# Patient Record
Sex: Male | Born: 1975 | Marital: Married | Smoking: Current some day smoker
Health system: Southern US, Community
[De-identification: ages and names within clinical notes are randomized; demographics above are authoritative.]

## PROBLEM LIST (undated history)

## (undated) DIAGNOSIS — I10 Essential (primary) hypertension: Secondary | ICD-10-CM

## (undated) DIAGNOSIS — F32A Depression, unspecified: Secondary | ICD-10-CM

## (undated) DIAGNOSIS — F419 Anxiety disorder, unspecified: Secondary | ICD-10-CM

## (undated) DIAGNOSIS — S0990XA Unspecified injury of head, initial encounter: Secondary | ICD-10-CM

## (undated) DIAGNOSIS — F329 Major depressive disorder, single episode, unspecified: Secondary | ICD-10-CM

## (undated) DIAGNOSIS — S2239XA Fracture of one rib, unspecified side, initial encounter for closed fracture: Secondary | ICD-10-CM

## (undated) DIAGNOSIS — S27339A Laceration of lung, unspecified, initial encounter: Secondary | ICD-10-CM

## (undated) HISTORY — DX: Major depressive disorder, single episode, unspecified: F32.9

## (undated) HISTORY — DX: Essential (primary) hypertension: I10

## (undated) HISTORY — DX: Anxiety disorder, unspecified: F41.9

## (undated) HISTORY — DX: Depression, unspecified: F32.A

## (undated) HISTORY — PX: OTHER SURGICAL HISTORY: SHX169

## (undated) HISTORY — DX: Laceration of lung, unspecified, initial encounter: S27.339A

## (undated) HISTORY — DX: Unspecified injury of head, initial encounter: S09.90XA

---

## 2009-01-20 ENCOUNTER — Emergency Department: Payer: Self-pay | Admitting: Emergency Medicine

## 2009-09-06 ENCOUNTER — Emergency Department: Payer: Self-pay | Admitting: Emergency Medicine

## 2009-09-10 ENCOUNTER — Emergency Department: Payer: Self-pay | Admitting: Emergency Medicine

## 2010-02-16 ENCOUNTER — Inpatient Hospital Stay: Payer: Self-pay | Admitting: Internal Medicine

## 2010-02-18 ENCOUNTER — Emergency Department: Payer: Self-pay | Admitting: Emergency Medicine

## 2010-09-25 ENCOUNTER — Emergency Department: Payer: Self-pay | Admitting: Emergency Medicine

## 2011-03-13 ENCOUNTER — Emergency Department: Payer: Self-pay | Admitting: Emergency Medicine

## 2011-06-09 ENCOUNTER — Emergency Department: Payer: Self-pay | Admitting: Emergency Medicine

## 2012-07-04 ENCOUNTER — Ambulatory Visit: Payer: Self-pay | Admitting: Emergency Medicine

## 2013-05-21 ENCOUNTER — Emergency Department: Payer: Self-pay | Admitting: Emergency Medicine

## 2013-10-11 ENCOUNTER — Emergency Department: Payer: Self-pay | Admitting: Emergency Medicine

## 2013-10-11 LAB — URINALYSIS, COMPLETE
Blood: NEGATIVE
Glucose,UR: NEGATIVE mg/dL (ref 0–75)
Leukocyte Esterase: NEGATIVE
Nitrite: NEGATIVE
Ph: 7 (ref 4.5–8.0)
Specific Gravity: 1.026 (ref 1.003–1.030)
Squamous Epithelial: <1
WBC UR: 3 /HPF (ref 0–5)

## 2013-10-11 LAB — COMPREHENSIVE METABOLIC PANEL
Albumin: 4 g/dL (ref 3.4–5.0)
Alkaline Phosphatase: 86 U/L (ref 50–136)
BUN: 14 mg/dL (ref 7–18)
Chloride: 103 mmol/L (ref 98–107)
Co2: 28 mmol/L (ref 21–32)
Creatinine: 0.91 mg/dL (ref 0.60–1.30)
EGFR (Non-African Amer.): >60
Osmolality: 281 (ref 275–301)
Potassium: 3.4 mmol/L — ABNORMAL LOW (ref 3.5–5.1)
SGOT(AST): 82 U/L — ABNORMAL HIGH (ref 15–37)
SGPT (ALT): 33 U/L (ref 12–78)
Total Protein: 7.9 g/dL (ref 6.4–8.2)

## 2013-10-11 LAB — CBC WITH DIFFERENTIAL/PLATELET
Basophil %: 0.7 %
Eosinophil #: 0.1 10*3/uL (ref 0.0–0.7)
Eosinophil %: 0.7 %
HCT: 38.2 % — ABNORMAL LOW (ref 40.0–52.0)
HGB: 13.2 g/dL (ref 13.0–18.0)
Lymphocyte #: 2.4 10*3/uL (ref 1.0–3.6)
Lymphocyte %: 21 %
MCH: 28.9 pg (ref 26.0–34.0)
MCHC: 34.6 g/dL (ref 32.0–36.0)
MCV: 84 fL (ref 80–100)
Monocyte #: 0.4 x10 3/mm (ref 0.2–1.0)
Monocyte %: 3.7 %
Neutrophil %: 73.9 %
Platelet: 231 10*3/uL (ref 150–440)
RBC: 4.57 10*6/uL (ref 4.40–5.90)
RDW: 15.3 % — ABNORMAL HIGH (ref 11.5–14.5)
WBC: 11.3 10*3/uL — ABNORMAL HIGH (ref 3.8–10.6)

## 2014-01-10 ENCOUNTER — Emergency Department: Payer: Self-pay | Admitting: Emergency Medicine

## 2014-04-12 ENCOUNTER — Emergency Department: Payer: Self-pay | Admitting: Emergency Medicine

## 2014-04-12 LAB — URINALYSIS, COMPLETE
Bacteria: NONE SEEN
Bilirubin,UR: NEGATIVE
Blood: NEGATIVE
GLUCOSE, UR: NEGATIVE mg/dL (ref 0–75)
Leukocyte Esterase: NEGATIVE
Nitrite: NEGATIVE
Ph: 8 (ref 4.5–8.0)
SPECIFIC GRAVITY: 1.023 (ref 1.003–1.030)
WBC UR: NONE SEEN /HPF (ref 0–5)

## 2014-04-12 LAB — CBC WITH DIFFERENTIAL/PLATELET
Basophil #: 0.1 10*3/uL (ref 0.0–0.1)
Basophil %: 0.5 %
EOS PCT: 0.1 %
Eosinophil #: 0 10*3/uL (ref 0.0–0.7)
HCT: 42.2 % (ref 40.0–52.0)
HGB: 13.7 g/dL (ref 13.0–18.0)
Lymphocyte #: 1.4 10*3/uL (ref 1.0–3.6)
Lymphocyte %: 10.4 %
MCH: 28.4 pg (ref 26.0–34.0)
MCHC: 32.5 g/dL (ref 32.0–36.0)
MCV: 87 fL (ref 80–100)
Monocyte #: 0.4 x10 3/mm (ref 0.2–1.0)
Monocyte %: 2.6 %
NEUTROS PCT: 86.4 %
Neutrophil #: 11.6 10*3/uL — ABNORMAL HIGH (ref 1.4–6.5)
Platelet: 208 10*3/uL (ref 150–440)
RBC: 4.83 10*6/uL (ref 4.40–5.90)
RDW: 14.9 % — AB (ref 11.5–14.5)
WBC: 13.4 10*3/uL — AB (ref 3.8–10.6)

## 2014-04-12 LAB — COMPREHENSIVE METABOLIC PANEL
ALK PHOS: 79 U/L
ALT: 27 U/L (ref 12–78)
ANION GAP: 17 — AB (ref 7–16)
Albumin: 4.3 g/dL (ref 3.4–5.0)
BUN: 9 mg/dL (ref 7–18)
Bilirubin,Total: 0.6 mg/dL (ref 0.2–1.0)
CALCIUM: 9.5 mg/dL (ref 8.5–10.1)
Chloride: 100 mmol/L (ref 98–107)
Co2: 25 mmol/L (ref 21–32)
Creatinine: 1.07 mg/dL (ref 0.60–1.30)
EGFR (Non-African Amer.): >60
Glucose: 139 mg/dL — ABNORMAL HIGH (ref 65–99)
Osmolality: 284 (ref 275–301)
POTASSIUM: 3.6 mmol/L (ref 3.5–5.1)
SGOT(AST): 45 U/L — ABNORMAL HIGH (ref 15–37)
Sodium: 142 mmol/L (ref 136–145)
Total Protein: 8.7 g/dL — ABNORMAL HIGH (ref 6.4–8.2)

## 2014-04-12 LAB — LIPASE, BLOOD: LIPASE: 141 U/L (ref 73–393)

## 2015-02-10 ENCOUNTER — Ambulatory Visit: Payer: Self-pay | Admitting: Emergency Medicine

## 2015-02-11 ENCOUNTER — Emergency Department: Payer: Self-pay | Admitting: Emergency Medicine

## 2015-03-21 ENCOUNTER — Emergency Department (HOSPITAL_COMMUNITY): Payer: 59

## 2015-03-21 ENCOUNTER — Emergency Department (HOSPITAL_COMMUNITY)
Admission: EM | Admit: 2015-03-21 | Discharge: 2015-03-21 | Disposition: A | Discharge status: Home / Self Care | Payer: 59 | Attending: Emergency Medicine | Admitting: Emergency Medicine

## 2015-03-21 ENCOUNTER — Encounter (HOSPITAL_COMMUNITY): Payer: Self-pay

## 2015-03-21 DIAGNOSIS — D72829 Elevated white blood cell count, unspecified: Secondary | ICD-10-CM | POA: Diagnosis not present

## 2015-03-21 DIAGNOSIS — Z8781 Personal history of (healed) traumatic fracture: Secondary | ICD-10-CM | POA: Diagnosis not present

## 2015-03-21 DIAGNOSIS — R1110 Vomiting, unspecified: Secondary | ICD-10-CM | POA: Diagnosis not present

## 2015-03-21 DIAGNOSIS — R1010 Upper abdominal pain, unspecified: Secondary | ICD-10-CM | POA: Diagnosis not present

## 2015-03-21 DIAGNOSIS — R1033 Periumbilical pain: Secondary | ICD-10-CM | POA: Insufficient documentation

## 2015-03-21 DIAGNOSIS — Z720 Tobacco use: Secondary | ICD-10-CM | POA: Diagnosis not present

## 2015-03-21 DIAGNOSIS — Z791 Long term (current) use of non-steroidal anti-inflammatories (NSAID): Secondary | ICD-10-CM | POA: Diagnosis not present

## 2015-03-21 DIAGNOSIS — R109 Unspecified abdominal pain: Secondary | ICD-10-CM | POA: Diagnosis present

## 2015-03-21 DIAGNOSIS — R1013 Epigastric pain: Secondary | ICD-10-CM | POA: Insufficient documentation

## 2015-03-21 HISTORY — DX: Fracture of one rib, unspecified side, initial encounter for closed fracture: S22.39XA

## 2015-03-21 LAB — URINALYSIS, ROUTINE W REFLEX MICROSCOPIC
Bilirubin Urine: NEGATIVE
GLUCOSE, UA: NEGATIVE mg/dL
Ketones, ur: NEGATIVE mg/dL
Leukocytes, UA: NEGATIVE
Nitrite: NEGATIVE
Protein, ur: >300 mg/dL — AB
SPECIFIC GRAVITY, URINE: 1.025 (ref 1.005–1.030)
Urobilinogen, UA: 0.2 mg/dL (ref 0.0–1.0)
pH: 7 (ref 5.0–8.0)

## 2015-03-21 LAB — COMPREHENSIVE METABOLIC PANEL
ALK PHOS: 65 U/L (ref 39–117)
ALT: 19 U/L (ref 0–53)
AST: 31 U/L (ref 0–37)
Albumin: 4.3 g/dL (ref 3.5–5.2)
Anion gap: 8 (ref 5–15)
BILIRUBIN TOTAL: 0.7 mg/dL (ref 0.3–1.2)
BUN: 20 mg/dL (ref 6–23)
CHLORIDE: 106 mmol/L (ref 96–112)
CO2: 25 mmol/L (ref 19–32)
CREATININE: 1.1 mg/dL (ref 0.50–1.35)
Calcium: 9.1 mg/dL (ref 8.4–10.5)
GFR calc Af Amer: >90 mL/min (ref >90)
GFR, EST NON AFRICAN AMERICAN: 83 mL/min — AB (ref >90)
Glucose, Bld: 144 mg/dL — ABNORMAL HIGH (ref 70–99)
POTASSIUM: 4 mmol/L (ref 3.5–5.1)
Sodium: 139 mmol/L (ref 135–145)
Total Protein: 8.1 g/dL (ref 6.0–8.3)

## 2015-03-21 LAB — CBC WITH DIFFERENTIAL/PLATELET
BASOS ABS: 0 10*3/uL (ref 0.0–0.1)
Basophils Relative: 0 % (ref 0–1)
EOS ABS: 0.1 10*3/uL (ref 0.0–0.7)
EOS PCT: 1 % (ref 0–5)
HCT: 38.2 % — ABNORMAL LOW (ref 39.0–52.0)
Hemoglobin: 12.8 g/dL — ABNORMAL LOW (ref 13.0–17.0)
Lymphocytes Relative: 15 % (ref 12–46)
Lymphs Abs: 2.7 10*3/uL (ref 0.7–4.0)
MCH: 28 pg (ref 26.0–34.0)
MCHC: 33.5 g/dL (ref 30.0–36.0)
MCV: 83.6 fL (ref 78.0–100.0)
MONO ABS: 0.8 10*3/uL (ref 0.1–1.0)
MONOS PCT: 4 % (ref 3–12)
NEUTROS ABS: 15.1 10*3/uL — AB (ref 1.7–7.7)
Neutrophils Relative %: 80 % — ABNORMAL HIGH (ref 43–77)
Platelets: 195 10*3/uL (ref 150–400)
RBC: 4.57 MIL/uL (ref 4.22–5.81)
RDW: 15.1 % (ref 11.5–15.5)
WBC: 18.7 10*3/uL — ABNORMAL HIGH (ref 4.0–10.5)

## 2015-03-21 LAB — URINE MICROSCOPIC-ADD ON

## 2015-03-21 LAB — LIPASE, BLOOD: LIPASE: 31 U/L (ref 11–59)

## 2015-03-21 MED ORDER — METRONIDAZOLE 500 MG PO TABS
500.0000 mg | ORAL_TABLET | Freq: Two times a day (BID) | ORAL | Status: DC
Start: 2015-03-21 — End: 2015-06-06

## 2015-03-21 MED ORDER — SODIUM CHLORIDE 0.9 % IV BOLUS (SEPSIS)
1000.0000 mL | Freq: Once | INTRAVENOUS | Status: AC
  Administered 2015-03-21: 1000 mL via INTRAVENOUS

## 2015-03-21 MED ORDER — IOHEXOL 300 MG/ML  SOLN
50.0000 mL | Freq: Once | INTRAMUSCULAR | Status: AC | PRN
  Administered 2015-03-21: 50 mL via ORAL

## 2015-03-21 MED ORDER — HYDROMORPHONE HCL 1 MG/ML IJ SOLN
1.0000 mg | Freq: Once | INTRAMUSCULAR | Status: AC
  Administered 2015-03-21: 1 mg via INTRAVENOUS
  Filled 2015-03-21: qty 1

## 2015-03-21 MED ORDER — OMEPRAZOLE 20 MG PO CPDR: 20.0000 mg | DELAYED_RELEASE_CAPSULE | Freq: Every day | ORAL | Status: DC

## 2015-03-21 MED ORDER — DICYCLOMINE HCL 20 MG PO TABS: 20.0000 mg | ORAL_TABLET | Freq: Two times a day (BID) | ORAL | Status: DC

## 2015-03-21 MED ORDER — GI COCKTAIL ~~LOC~~
30.0000 mL | Freq: Once | ORAL | Status: AC
  Administered 2015-03-21: 30 mL via ORAL
  Filled 2015-03-21: qty 30

## 2015-03-21 MED ORDER — PROCHLORPERAZINE EDISYLATE 5 MG/ML IJ SOLN
10.0000 mg | Freq: Once | INTRAMUSCULAR | Status: AC
  Administered 2015-03-21: 10 mg via INTRAVENOUS
  Filled 2015-03-21: qty 2

## 2015-03-21 MED ORDER — CIPROFLOXACIN HCL 500 MG PO TABS: 500.0000 mg | ORAL_TABLET | Freq: Two times a day (BID) | ORAL | Status: DC

## 2015-03-21 MED ORDER — IOHEXOL 300 MG/ML  SOLN
100.0000 mL | Freq: Once | INTRAMUSCULAR | Status: AC | PRN
  Administered 2015-03-21: 100 mL via INTRAVENOUS

## 2015-03-21 MED ORDER — ONDANSETRON 4 MG PO TBDP: 4.0000 mg | ORAL_TABLET | Freq: Three times a day (TID) | ORAL | Status: DC | PRN

## 2015-03-21 NOTE — ED Notes (Signed)
Patient c/o generalized abdominal pain and vomiting x 10 or more since this AM. Patient denies any diarrhea or fever.

## 2015-03-21 NOTE — Discharge Instructions (Signed)
As discussed, today's evaluation has been somewhat reassuring, but with her continued abdominal pain, it is important that you follow-up with our gastroenterologist to complete her evaluation.  Return here for concerning changes in your condition.    Abdominal Pain Many things can cause abdominal pain. Usually, abdominal pain is not caused by a disease and will improve without treatment. It can often be observed and treated at home. Your health care provider will do a physical exam and possibly order blood tests and X-rays to help determine the seriousness of your pain. However, in many cases, more time must pass before a clear cause of the pain can be found. Before that point, your health care provider may not know if you need more testing or further treatment. HOME CARE INSTRUCTIONS  Monitor your abdominal pain for any changes. The following actions may help to alleviate any discomfort you are experiencing:  Only take over-the-counter or prescription medicines as directed by your health care provider.  Do not take laxatives unless directed to do so by your health care provider.  Try a clear liquid diet (broth, tea, or water) as directed by your health care provider. Slowly move to a bland diet as tolerated. SEEK MEDICAL CARE IF:  You have unexplained abdominal pain.  You have abdominal pain associated with nausea or diarrhea.  You have pain when you urinate or have a bowel movement.  You experience abdominal pain that wakes you in the night.  You have abdominal pain that is worsened or improved by eating food.  You have abdominal pain that is worsened with eating fatty foods.  You have a fever. SEEK IMMEDIATE MEDICAL CARE IF:   Your pain does not go away within 2 hours.  You keep throwing up (vomiting).  Your pain is felt only in portions of the abdomen, such as the right side or the left lower portion of the abdomen.  You pass bloody or black tarry stools. MAKE SURE  YOU:  Understand these instructions.   Will watch your condition.   Will get help right away if you are not doing well or get worse.  Document Released: 08/19/2005 Document Revised: 11/14/2013 Document Reviewed: 07/19/2013 [facility]ExitCare Patient Information 2015 [place]Care, [place]. This information is not intended to replace advice given to you by your health care provider. Make sure you discuss any questions you have with your health care provider.

## 2015-03-21 NOTE — ED Notes (Signed)
Pt, sent by [facility], c/o generalized abdominal pain and n/v x 2 years.  Pt reported that symptoms hve gotten more severe x 2 days.  Zofran given at office.

## 2015-03-21 NOTE — ED Notes (Signed)
Patient transported to CT 

## 2015-03-21 NOTE — Progress Notes (Signed)
[id number] male self pay pt recently moved from [address] [place] [phone] ( [place]) to [address] [place] [facility]), c/o generalized abdominal pain and n/v x 2 years. Pt reported that symptoms hve gotten more severe x 2 days. Zofran given at office  CM spoke with pt who [place] [facility] [place] [place]. CM discussed and provided written information for self pay pcps vs EDPs, importance of pcp for f/u care, www.needymeds.org, www.goodrx.com, discounted pharmacies and other [place] resources such as [facility] , [facility], affordable care act,  financial assistance, self pay dental services, [place] med assist, DSS and  health department  Reviewed resources for [place] self pay pcps like [facility], family medicine at [address], [facility] family practice, general medical clinics, [facility] urgent care plus others, medication resources, [facility] out patient pharmacies and housing Pt voiced understanding and appreciation of resources provided   Provided [facility] contact information  Agreed to referral CM completed referral  Pt states he [facility] gold [facility]let [facility] checked lost and found and CT room staff.   CM called [facility] on [address] and [address] Sheralyn Boatmanoni at [employer] [phone] states pt's domestic partner is also calling to inquire about bracelet and they are looking for it at [facility] Pt present in front of CM at acute ED nursing station when CM called both urgent cares and voiced understanding Cm provided him with [address] [facility] address and number

## 2015-03-21 NOTE — ED Provider Notes (Signed)
CSN: [id number]     Arrival date & time 03/21/15  1012 History   First MD Initiated Contact with Patient 03/21/15 1042     Chief Complaint  Patient presents with  . Emesis  . Abdominal Pain     (Consider location/radiation/quality/duration/timing/severity/associated sxs/prior Treatment) HPI Patient presents with concern of increasing abdominal pain nausea, vomiting. Patient has history of abdominal pain for at least 2 years, with unclear diagnosis, but with suspicion of ulcer. He states over the past 24 hours he has had increased abdominal pain in a typical distributional, with much more severe, sharp characteristics. Pain is midline upper abdomen, with diffuse radiation. There is concurrent anorexia, nausea, multiple episodes of vomiting today. No diarrhea today. No relief with anything. Fever, chills, chest pain, dyspnea. Patient was previously advised to follow-up with gastroenterology, but has not done so. Patient smokes, drinks occasionally.   Smoking cessation provided, particularly in light of this patient's evaluation in the ED.  Past Medical History  Diagnosis Date  . Rib fracture    No past surgical history on file. History reviewed. No pertinent family history. History  Substance Use Topics  . Smoking status: Current Every Day Smoker -- 0.25 packs/day    Types: Cigarettes  . Smokeless tobacco: Never Used  . Alcohol Use: Yes     Comment: occasionally    Review of Systems  Constitutional:       Per HPI, otherwise negative  HENT:       Per HPI, otherwise negative  Respiratory:       Per HPI, otherwise negative  Cardiovascular:       Per HPI, otherwise negative  Gastrointestinal: Positive for vomiting and abdominal pain.  Endocrine:       Negative aside from HPI  Genitourinary:       Neg aside from HPI   Musculoskeletal:       Per HPI, otherwise negative  Skin: Negative.   Neurological: Negative for syncope.      Allergies  Review of patient's  allergies indicates no known allergies.  Home Medications   Prior to Admission medications   Medication Sig Start Date End Date Taking? Authorizing Provider  meloxicam (MOBIC) 15 MG tablet Take 15 mg by mouth daily as needed for pain.   Yes Historical Provider, MD   BP 176/96 mmHg  Pulse 70  Temp(Src) 97.8 F (36.6 C) (Oral)  Resp 20  SpO2 97% Physical Exam  Constitutional: He is oriented to person, place, and time. He appears well-developed. No distress.  HENT:  Head: Normocephalic and atraumatic.  Eyes: Conjunctivae and EOM are normal.  Cardiovascular: Normal rate and regular rhythm.   Pulmonary/Chest: Effort normal. No stridor. No respiratory distress.  Abdominal: He exhibits no distension. There is tenderness in the epigastric area and periumbilical area.  Musculoskeletal: He exhibits no edema.  Neurological: He is alert and oriented to person, place, and time.  Skin: Skin is warm and dry.  Psychiatric: He has a normal mood and affect.  Nursing note and vitals reviewed.   ED Course  Procedures (including critical care time) Labs Review Labs Reviewed  CBC WITH DIFFERENTIAL/PLATELET - Abnormal; Notable for the following:    WBC 18.7 (*)    Hemoglobin 12.8 (*)    HCT 38.2 (*)    Neutrophils Relative % 80 (*)    Neutro Abs 15.1 (*)    All other components within normal limits  COMPREHENSIVE METABOLIC PANEL - Abnormal; Notable for the following:    Glucose, Bld  144 (*)    GFR calc non Af Amer 83 (*)    All other components within normal limits  URINALYSIS, ROUTINE W REFLEX MICROSCOPIC - Abnormal; Notable for the following:    APPearance CLOUDY (*)    Hgb urine dipstick TRACE (*)    Protein, ur >300 (*)    All other components within normal limits  LIPASE, BLOOD  URINE MICROSCOPIC-ADD ON     On repeat exam the patient remains in a great discomfort. With his abdominal pain, CT scan will be performed.   Imaging Review Ct Abdomen Pelvis W Contrast  03/21/2015    CLINICAL DATA:  Acute onset of generalized abdominal pain earlier today. Nausea and vomiting.  EXAM: CT ABDOMEN AND PELVIS WITH CONTRAST  TECHNIQUE: Multidetector CT imaging of the abdomen and pelvis was performed using the standard protocol following bolus administration of intravenous contrast.  CONTRAST:  100mL OMNIPAQUE IOHEXOL 300 MG/ML IV. Oral contrast was also administered.  COMPARISON:  10/11/2013, 02/17/2010.  FINDINGS: Liver:  Normal in size and appearance.  Biliary: Normal-appearing gallbladder without calcified gallstones. No biliary ductal dilation.  Spleen:  Normal in size and appearance.  Pancreas:  Normal in appearance.  No pancreatic ductal dilation.  Adrenal glands:  Normal in appearance.  Genitourinary: Both kidneys normal in size and appearance. No evidence of urinary tract calculi. Normal-appearing decompressed urinary bladder.  Median lobe prostate gland enlargement. Remainder of the gland normal in size and appearance. Normal seminal vesicles.  Gastrointestinal: Stomach normal in appearance for degree of distention. Normal-appearing small bowel. Entire colon decompressed which likely accounts for the apparent diffuse wall thickening. Normal appendix in the right upper pelvis.  Vascular: Moderate aortoiliac atherosclerosis. Patent visceral arteries. Accessory lower pole right renal artery.  Lymphatic:  No pathologic lymphadenopathy in the abdomen or pelvis.  Ascites: Absent.  Musculoskeletal: Regional skeleton intact.  Other findings: None.  Visualized lower thorax: Heart size upper normal. Lung bases clear apart from the expected dependent atelectasis.  IMPRESSION: 1. Apparent wall thickening involving the entire colon I believe is related to the fact that the colon is decompressed rather than true colitis. Does the patient have diarrhea? 2. Otherwise, no acute abnormality in the abdomen or pelvis. 3. Moderate aortoiliac atherosclerosis, advanced for age.   Electronically Signed   By: Hulan Saashomas   Lawrence M.D.   On: 03/21/2015 14:13    2:44 PM I reviewed the results (including imaging as performed), agree with the interpretation  On repeat exam the patient appears better.  We reviewed all findings.   MDM   Young male presents with worsening abdominal pain. On exam patient is afebrile, with non-peritoneal abdomen, though with notable midline tenderness in the upper abdomen.  With tenderness to palpation, CT scan was performed.  This was largely reassuring, though there is suspicion for colitis given the patient's leukocytosis, pain. Patient has not had colonoscopy, was started on antibiotics, analgesics, will follow-up with gastroenterology.   Gerhard Munchobert Tu Shimmel, MD 03/21/15 [id number]

## 2015-06-06 ENCOUNTER — Encounter (HOSPITAL_COMMUNITY): Payer: Self-pay | Admitting: Family Medicine

## 2015-06-06 ENCOUNTER — Emergency Department (HOSPITAL_COMMUNITY)
Admission: EM | Admit: 2015-06-06 | Discharge: 2015-06-06 | Disposition: A | Discharge status: Home / Self Care | Payer: 59 | Attending: Emergency Medicine | Admitting: Emergency Medicine

## 2015-06-06 DIAGNOSIS — R112 Nausea with vomiting, unspecified: Secondary | ICD-10-CM | POA: Diagnosis not present

## 2015-06-06 DIAGNOSIS — Z720 Tobacco use: Secondary | ICD-10-CM | POA: Diagnosis not present

## 2015-06-06 DIAGNOSIS — R61 Generalized hyperhidrosis: Secondary | ICD-10-CM | POA: Diagnosis not present

## 2015-06-06 DIAGNOSIS — Z792 Long term (current) use of antibiotics: Secondary | ICD-10-CM | POA: Insufficient documentation

## 2015-06-06 DIAGNOSIS — Z79899 Other long term (current) drug therapy: Secondary | ICD-10-CM | POA: Insufficient documentation

## 2015-06-06 DIAGNOSIS — K219 Gastro-esophageal reflux disease without esophagitis: Secondary | ICD-10-CM | POA: Insufficient documentation

## 2015-06-06 DIAGNOSIS — R1013 Epigastric pain: Secondary | ICD-10-CM | POA: Insufficient documentation

## 2015-06-06 DIAGNOSIS — Z8781 Personal history of (healed) traumatic fracture: Secondary | ICD-10-CM | POA: Diagnosis not present

## 2015-06-06 LAB — CBC WITH DIFFERENTIAL/PLATELET
BASOS ABS: 0 10*3/uL (ref 0.0–0.1)
BASOS PCT: 0 % (ref 0–1)
Eosinophils Absolute: 0 10*3/uL (ref 0.0–0.7)
Eosinophils Relative: 0 % (ref 0–5)
HEMATOCRIT: 40.6 % (ref 39.0–52.0)
Hemoglobin: 13.9 g/dL (ref 13.0–17.0)
LYMPHS ABS: 1.8 10*3/uL (ref 0.7–4.0)
LYMPHS PCT: 16 % (ref 12–46)
MCH: 28.2 pg (ref 26.0–34.0)
MCHC: 34.2 g/dL (ref 30.0–36.0)
MCV: 82.4 fL (ref 78.0–100.0)
Monocytes Absolute: 0.3 10*3/uL (ref 0.1–1.0)
Monocytes Relative: 3 % (ref 3–12)
NEUTROS ABS: 9.5 10*3/uL — AB (ref 1.7–7.7)
Neutrophils Relative %: 81 % — ABNORMAL HIGH (ref 43–77)
Platelets: 222 10*3/uL (ref 150–400)
RBC: 4.93 MIL/uL (ref 4.22–5.81)
RDW: 15.6 % — AB (ref 11.5–15.5)
WBC: 11.7 10*3/uL — ABNORMAL HIGH (ref 4.0–10.5)

## 2015-06-06 LAB — COMPREHENSIVE METABOLIC PANEL
ALBUMIN: 4.5 g/dL (ref 3.5–5.0)
ALT: 18 U/L (ref 17–63)
ANION GAP: 12 (ref 5–15)
AST: 33 U/L (ref 15–41)
Alkaline Phosphatase: 77 U/L (ref 38–126)
BILIRUBIN TOTAL: 1 mg/dL (ref 0.3–1.2)
BUN: 14 mg/dL (ref 6–20)
CALCIUM: 9.6 mg/dL (ref 8.9–10.3)
CO2: 25 mmol/L (ref 22–32)
Chloride: 99 mmol/L — ABNORMAL LOW (ref 101–111)
Creatinine, Ser: 1.06 mg/dL (ref 0.61–1.24)
GFR calc Af Amer: >60 mL/min (ref >60)
GFR calc non Af Amer: >60 mL/min (ref >60)
Glucose, Bld: 128 mg/dL — ABNORMAL HIGH (ref 65–99)
Potassium: 3.5 mmol/L (ref 3.5–5.1)
Sodium: 136 mmol/L (ref 135–145)
Total Protein: 8.6 g/dL — ABNORMAL HIGH (ref 6.5–8.1)

## 2015-06-06 LAB — I-STAT CG4 LACTIC ACID, ED: LACTIC ACID, VENOUS: 3.19 mmol/L — AB (ref 0.5–2.0)

## 2015-06-06 LAB — LIPASE, BLOOD: Lipase: 22 U/L (ref 22–51)

## 2015-06-06 MED ORDER — DICYCLOMINE HCL 20 MG PO TABS
20.0000 mg | ORAL_TABLET | Freq: Once | ORAL | Status: AC
  Administered 2015-06-06: 20 mg via ORAL
  Filled 2015-06-06: qty 1

## 2015-06-06 MED ORDER — RANITIDINE HCL 150 MG/10ML PO SYRP: 300.0000 mg | ORAL_SOLUTION | Freq: Every day | ORAL | Status: DC

## 2015-06-06 MED ORDER — METOCLOPRAMIDE HCL 10 MG PO TABS: 10.0000 mg | ORAL_TABLET | Freq: Three times a day (TID) | ORAL | Status: DC | PRN

## 2015-06-06 MED ORDER — DICYCLOMINE HCL 20 MG PO TABS: 20.0000 mg | ORAL_TABLET | Freq: Two times a day (BID) | ORAL | Status: DC | PRN

## 2015-06-06 MED ORDER — ALUM & MAG HYDROXIDE-SIMETH 200-200-20 MG/5ML PO SUSP
30.0000 mL | Freq: Once | ORAL | Status: AC
  Administered 2015-06-06: 30 mL via ORAL
  Filled 2015-06-06: qty 30

## 2015-06-06 MED ORDER — HYDROMORPHONE HCL 1 MG/ML IJ SOLN
1.0000 mg | Freq: Once | INTRAMUSCULAR | Status: AC
  Administered 2015-06-06: 1 mg via INTRAVENOUS
  Filled 2015-06-06: qty 1

## 2015-06-06 MED ORDER — SODIUM CHLORIDE 0.9 % IV BOLUS (SEPSIS)
1000.0000 mL | Freq: Once | INTRAVENOUS | Status: AC
  Administered 2015-06-06: 1000 mL via INTRAVENOUS

## 2015-06-06 MED ORDER — RANITIDINE HCL 150 MG/10ML PO SYRP
300.0000 mg | ORAL_SOLUTION | Freq: Once | ORAL | Status: AC
  Administered 2015-06-06: 300 mg via ORAL
  Filled 2015-06-06: qty 20

## 2015-06-06 MED ORDER — METOCLOPRAMIDE HCL 5 MG/ML IJ SOLN
10.0000 mg | Freq: Once | INTRAMUSCULAR | Status: AC
  Administered 2015-06-06: 10 mg via INTRAVENOUS
  Filled 2015-06-06: qty 2

## 2015-06-06 NOTE — ED Provider Notes (Signed)
CSN: [id number]     Arrival date & time 06/06/15  [id number] History   First MD Initiated Contact with Patient 06/06/15 [phone]     Chief Complaint  Patient presents with  . Abdominal Pain  . Nausea  . Emesis     (Consider location/radiation/quality/duration/timing/severity/associated sxs/prior Treatment) HPI  [name] is a 39yo male, no sig PMH, here with midepigastric abdominal pain.  She has self diagnosed himself with an ulcer but has no history of this.  He states he has had reflux in the past.  His abd pain is burning and woke him out of sleep 4 hours ago.  Since then, he has had several episodes of N/V.  No medications at home were helping at that time.  He was suppose to see GI after his last ED visit in April 2016 but did not make an appointment because his symptoms got better.  He was given GI cocktail and dilaudid at that time.  He currently appears acutely distressed.  He denies fevers or recnt infections.  He has no further complaints.   10 Systems reviewed and are negative for acute change except as noted in the HPI.     Past Medical History  Diagnosis Date  . Rib fracture    History reviewed. No pertinent past surgical history. History reviewed. No pertinent family history. History  Substance Use Topics  . Smoking status: Current Every Day Smoker -- 0.25 packs/day    Types: Cigarettes  . Smokeless tobacco: Never Used  . Alcohol Use: Yes     Comment: occasionally    Review of Systems    Allergies  Review of patient's allergies indicates no known allergies.  Home Medications   Prior to Admission medications   Medication Sig Start Date End Date Taking? Authorizing Provider  Dextrose-Fructose-Sod Citrate (NAUZENE) [phone] MG CHEW Chew 1 tablet by mouth every 4 (four) hours as needed (nausea).   Yes Historical Provider, MD  famotidine (PEPCID AC) 10 MG chewable tablet Chew 10 mg by mouth 2 (two) times daily as needed for heartburn.   Yes Historical Provider, MD   ciprofloxacin (CIPRO) 500 MG tablet Take 1 tablet (500 mg total) by mouth 2 (two) times daily. Patient not taking: Reported on 06/06/2015 03/21/15   Tony Munchobert Lockwood, MD  dicyclomine (BENTYL) 20 MG tablet Take 1 tablet (20 mg total) by mouth 2 (two) times daily. Patient not taking: Reported on 06/06/2015 03/21/15   Tony Munchobert Lockwood, MD  metroNIDAZOLE (FLAGYL) 500 MG tablet Take 1 tablet (500 mg total) by mouth 2 (two) times daily. Patient not taking: Reported on 06/06/2015 03/21/15   Tony Munchobert Lockwood, MD  omeprazole (PRILOSEC) 20 MG capsule Take 1 capsule (20 mg total) by mouth daily. Patient not taking: Reported on 06/06/2015 03/21/15   Tony Munchobert Lockwood, MD  ondansetron (ZOFRAN ODT) 4 MG disintegrating tablet Take 1 tablet (4 mg total) by mouth every 8 (eight) hours as needed for nausea or vomiting. Patient not taking: Reported on 06/06/2015 03/21/15   Tony Munchobert Lockwood, MD   BP 169/104 mmHg  Pulse 94  Temp(Src) 98.7 F (37.1 C) (Oral)  Resp 22  Ht 6\' 2"  (1.88 m)  Wt 210 lb (95.255 kg)  BMI 26.95 kg/m2  SpO2 100% Physical Exam  Constitutional: He is oriented to person, place, and time. Vital signs are normal. He appears well-developed and well-nourished.  Non-toxic appearance. He does not appear ill. No distress.  HENT:  Head: Normocephalic and atraumatic.  Nose: Nose normal.  Mouth/Throat: Oropharynx is clear  and moist. No oropharyngeal exudate.  Eyes: Conjunctivae and EOM are normal. Pupils are equal, round, and reactive to light. No scleral icterus.  Neck: Normal range of motion. Neck supple. No tracheal deviation, no edema, no erythema and normal range of motion present. No thyroid mass and no thyromegaly present.  Cardiovascular: Normal rate, regular rhythm, S1 normal, S2 normal, normal heart sounds, intact distal pulses and normal pulses.  Exam reveals no gallop and no friction rub.   No murmur heard. Pulses:      Radial pulses are 2+ on the right side, and 2+ on the left side.        Dorsalis pedis pulses are 2+ on the right side, and 2+ on the left side.  Pulmonary/Chest: Effort normal and breath sounds normal. No respiratory distress. He has no wheezes. He has no rhonchi. He has no rales.  Abdominal: Soft. Normal appearance and bowel sounds are normal. He exhibits no distension, no ascites and no mass. There is no hepatosplenomegaly. There is tenderness. There is no rebound, no guarding and no CVA tenderness.  midepigastric TTP  Musculoskeletal: Normal range of motion. He exhibits no edema or tenderness.  Lymphadenopathy:    He has no cervical adenopathy.  Neurological: He is alert and oriented to person, place, and time. He has normal strength. No cranial nerve deficit or sensory deficit.  Skin: Skin is warm and intact. No petechiae and no rash noted. He is diaphoretic. No erythema. No pallor.  Psychiatric: He has a normal mood and affect. His behavior is normal. Judgment normal.  Nursing note and vitals reviewed.   ED Course  Procedures (including critical care time) Labs Review Labs Reviewed  CBC WITH DIFFERENTIAL/PLATELET - Abnormal; Notable for the following:    WBC 11.7 (*)    RDW 15.6 (*)    Neutrophils Relative % 81 (*)    Neutro Abs 9.5 (*)    All other components within normal limits  COMPREHENSIVE METABOLIC PANEL - Abnormal; Notable for the following:    Chloride 99 (*)    Glucose, Bld 128 (*)    Total Protein 8.6 (*)    All other components within normal limits  I-STAT CG4 LACTIC ACID, ED - Abnormal; Notable for the following:    Lactic Acid, Venous 3.19 (*)    All other components within normal limits  LIPASE, BLOOD    Imaging Review No results found.   EKG Interpretation None      MDM   Final diagnoses:  None    Patient presents to the ED for Abd pain, N/V for the past several hours in the setting of reflux.  His abdomen is not guarded or with rebound, I dont think imaging is required.  He was given IVF, zantac, maalox, reglan,  bentyl, and dilaudid for symptomatic control.    Upon repeat evaluation, patient appears much more comfortable.  Diaphoresis and abdominal pain has stopped.  Labs are unremarkable.  He was educated on diagnosis and need to follow up with GI, he stated he will follow up this time.  Will DC with bentyl and reglan PRN, and daily zantac.  His VS remain within his normal limits, he is asymptomatic for his HTN, he is safe for DC.  Tomasita Crumble, MD 06/06/15 1351

## 2015-06-06 NOTE — Discharge Instructions (Signed)
Gastritis, Adult Tony Fuentes, see gastroenterology within 3 days for close follow-up. Take ranitidine daily for your stomach acid. Take Bentyl as needed for pain. Take Reglan as needed for nausea and vomiting. If any symptoms worsen come back to the emergency department immediately. Thank you. Gastritis is soreness and puffiness (inflammation) of the lining of the stomach. If you do not get help, gastritis can cause bleeding and sores (ulcers) in the stomach. HOME CARE   Only take medicine as told by your doctor.  If you were given antibiotic medicines, take them as told. Finish the medicines even if you start to feel better.  Drink enough fluids to keep your pee (urine) clear or pale yellow.  Avoid foods and drinks that make your problems worse. Foods you may want to avoid include:  Caffeine or alcohol.  Chocolate.  Mint.  Garlic and onions.  Spicy foods.  Citrus fruits, including oranges, lemons, or limes.  Food containing tomatoes, including sauce, chili, salsa, and pizza.  Fried and fatty foods.  Eat small meals throughout the day instead of large meals. GET HELP RIGHT AWAY IF:   You have black or dark red poop (stools).  You throw up (vomit) blood. It may look like coffee grounds.  You cannot keep fluids down.  Your belly (abdominal) pain gets worse.  You have a fever.  You do not feel better after 1 week.  You have any other questions or concerns. MAKE SURE YOU:   Understand these instructions.  Will watch your condition.  Will get help right away if you are not doing well or get worse. Document Released: 04/27/2008 Document Revised: 02/01/2012 Document Reviewed: 12/23/2011 [facility] Patient Information 2015 [place], [place]. This information is not intended to replace advice given to you by your health care provider. Make sure you discuss any questions you have with your health care provider.

## 2015-06-06 NOTE — ED Notes (Signed)
Patient is complaining of upper abd pain with nausea and vomiting. Pt states pain woke him up out of his sleep.

## 2015-06-08 ENCOUNTER — Encounter (HOSPITAL_COMMUNITY): Payer: Self-pay | Admitting: *Deleted

## 2015-06-08 ENCOUNTER — Emergency Department (HOSPITAL_COMMUNITY)
Admission: EM | Admit: 2015-06-08 | Discharge: 2015-06-08 | Disposition: A | Discharge status: Home / Self Care | Payer: 59 | Attending: Emergency Medicine | Admitting: Emergency Medicine

## 2015-06-08 DIAGNOSIS — R112 Nausea with vomiting, unspecified: Secondary | ICD-10-CM | POA: Insufficient documentation

## 2015-06-08 DIAGNOSIS — Z8781 Personal history of (healed) traumatic fracture: Secondary | ICD-10-CM | POA: Insufficient documentation

## 2015-06-08 DIAGNOSIS — Z720 Tobacco use: Secondary | ICD-10-CM | POA: Diagnosis not present

## 2015-06-08 DIAGNOSIS — R1013 Epigastric pain: Secondary | ICD-10-CM | POA: Insufficient documentation

## 2015-06-08 DIAGNOSIS — Z79899 Other long term (current) drug therapy: Secondary | ICD-10-CM | POA: Diagnosis not present

## 2015-06-08 LAB — COMPREHENSIVE METABOLIC PANEL
ALBUMIN: 4.3 g/dL (ref 3.5–5.0)
ALT: 19 U/L (ref 17–63)
ANION GAP: 11 (ref 5–15)
AST: 30 U/L (ref 15–41)
Alkaline Phosphatase: 67 U/L (ref 38–126)
BILIRUBIN TOTAL: 0.6 mg/dL (ref 0.3–1.2)
BUN: 18 mg/dL (ref 6–20)
CO2: 23 mmol/L (ref 22–32)
CREATININE: 1.13 mg/dL (ref 0.61–1.24)
Calcium: 9.5 mg/dL (ref 8.9–10.3)
Chloride: 103 mmol/L (ref 101–111)
GFR calc Af Amer: >60 mL/min (ref >60)
GFR calc non Af Amer: >60 mL/min (ref >60)
GLUCOSE: 147 mg/dL — AB (ref 65–99)
POTASSIUM: 3.7 mmol/L (ref 3.5–5.1)
Sodium: 137 mmol/L (ref 135–145)
Total Protein: 8.3 g/dL — ABNORMAL HIGH (ref 6.5–8.1)

## 2015-06-08 LAB — CBC WITH DIFFERENTIAL/PLATELET
BASOS ABS: 0 10*3/uL (ref 0.0–0.1)
Basophils Relative: 0 % (ref 0–1)
Eosinophils Absolute: 0.1 10*3/uL (ref 0.0–0.7)
Eosinophils Relative: 1 % (ref 0–5)
HCT: 40.3 % (ref 39.0–52.0)
HEMOGLOBIN: 14 g/dL (ref 13.0–17.0)
Lymphocytes Relative: 13 % (ref 12–46)
Lymphs Abs: 1.7 10*3/uL (ref 0.7–4.0)
MCH: 28.9 pg (ref 26.0–34.0)
MCHC: 34.7 g/dL (ref 30.0–36.0)
MCV: 83.3 fL (ref 78.0–100.0)
MONO ABS: 0.4 10*3/uL (ref 0.1–1.0)
Monocytes Relative: 3 % (ref 3–12)
NEUTROS ABS: 10.8 10*3/uL — AB (ref 1.7–7.7)
NEUTROS PCT: 83 % — AB (ref 43–77)
Platelets: 232 10*3/uL (ref 150–400)
RBC: 4.84 MIL/uL (ref 4.22–5.81)
RDW: 15.8 % — ABNORMAL HIGH (ref 11.5–15.5)
WBC: 13 10*3/uL — ABNORMAL HIGH (ref 4.0–10.5)

## 2015-06-08 LAB — LIPASE, BLOOD: LIPASE: 20 U/L — AB (ref 22–51)

## 2015-06-08 MED ORDER — KETOROLAC TROMETHAMINE 30 MG/ML IJ SOLN
30.0000 mg | Freq: Once | INTRAMUSCULAR | Status: AC
  Administered 2015-06-08: 30 mg via INTRAVENOUS
  Filled 2015-06-08: qty 1

## 2015-06-08 MED ORDER — MORPHINE SULFATE 4 MG/ML IJ SOLN
4.0000 mg | Freq: Once | INTRAMUSCULAR | Status: AC
  Administered 2015-06-08: 4 mg via INTRAVENOUS
  Filled 2015-06-08: qty 1

## 2015-06-08 MED ORDER — SODIUM CHLORIDE 0.9 % IV BOLUS (SEPSIS)
1000.0000 mL | Freq: Once | INTRAVENOUS | Status: AC
  Administered 2015-06-08: 1000 mL via INTRAVENOUS

## 2015-06-08 MED ORDER — ONDANSETRON 4 MG PO TBDP
4.0000 mg | ORAL_TABLET | Freq: Once | ORAL | Status: AC
  Administered 2015-06-08: 4 mg via ORAL
  Filled 2015-06-08: qty 1

## 2015-06-08 MED ORDER — METOCLOPRAMIDE HCL 5 MG/ML IJ SOLN
10.0000 mg | INTRAMUSCULAR | Status: AC
  Administered 2015-06-08: 10 mg via INTRAVENOUS
  Filled 2015-06-08: qty 2

## 2015-06-08 MED ORDER — PROMETHAZINE HCL 25 MG RE SUPP: 25.0000 mg | Freq: Four times a day (QID) | RECTAL | Status: DC | PRN

## 2015-06-08 MED ORDER — FAMOTIDINE IN NACL 20-0.9 MG/50ML-% IV SOLN
20.0000 mg | Freq: Once | INTRAVENOUS | Status: AC
  Administered 2015-06-08: 20 mg via INTRAVENOUS
  Filled 2015-06-08: qty 50

## 2015-06-08 NOTE — ED Provider Notes (Signed)
CSN: [id number]     Arrival date & time 06/08/15  1425 History   First MD Initiated Contact with Patient 06/08/15 1534     Chief Complaint  Patient presents with  . Emesis  . Abdominal Pain     (Consider location/radiation/quality/duration/timing/severity/associated sxs/prior Treatment) The history is provided by the patient and medical records.    This is a 39 y.o  M with PMH significant for rib fractures d/t trauma in the past, presenting to the ED for abdominal pain, nausea and vomiting for the next several days.  Patient was seen here 2 days ago for the same but symptoms have returned.  He states he again has burning pain in his epigastrium, states makes it difficult for him to get comfortable.  Nausea and vomiting continues, non-bloody, non-bilious.  No fever, chills, sweats.  Patient states he was seen by PCP yesterday, however could not have bloodwork done.  He asked for referral to GI, however was told it would take a few days.  Patient has been unable to tolerate any PO food/fluids or medications recently.  No prior abdominal surgeries.  VSS.  Past Medical History  Diagnosis Date  . Rib fracture    History reviewed. No pertinent past surgical history. No family history on file. History  Substance Use Topics  . Smoking status: Current Every Day Smoker -- 0.25 packs/day    Types: Cigarettes  . Smokeless tobacco: Never Used  . Alcohol Use: Yes     Comment: occasionally    Review of Systems  Gastrointestinal: Positive for nausea, vomiting and abdominal pain.  All other systems reviewed and are negative.     Allergies  Review of patient's allergies indicates no known allergies.  Home Medications   Prior to Admission medications   Medication Sig Start Date End Date Taking? Authorizing Provider  Dextrose-Fructose-Sod Citrate (NAUZENE) [phone] MG CHEW Chew 1 tablet by mouth every 4 (four) hours as needed (nausea).    Historical Provider, MD  dicyclomine (BENTYL) 20  MG tablet Take 1 tablet (20 mg total) by mouth 2 (two) times daily as needed (abdominal pain). 06/06/15   Tomasita Crumble, MD  famotidine (PEPCID AC) 10 MG chewable tablet Chew 10 mg by mouth 2 (two) times daily as needed for heartburn.    Historical Provider, MD  metoCLOPramide (REGLAN) 10 MG tablet Take 1 tablet (10 mg total) by mouth every 8 (eight) hours as needed for nausea or vomiting. 06/06/15   Tomasita Crumble, MD  ranitidine (ZANTAC) 150 MG/10ML syrup Take 20 mLs (300 mg total) by mouth daily. 06/06/15   Tomasita Crumble, MD   BP 156/106 mmHg  Pulse 99  Temp(Src) 98.6 F (37 C) (Oral)  Resp 18  SpO2 100%   Physical Exam  Constitutional: He is oriented to person, place, and time. He appears well-developed and well-nourished. No distress.  HENT:  Head: Normocephalic and atraumatic.  Mouth/Throat: Oropharynx is clear and moist.  Eyes: Conjunctivae and EOM are normal. Pupils are equal, round, and reactive to light.  Neck: Normal range of motion. Neck supple.  Cardiovascular: Normal rate, regular rhythm and normal heart sounds.   Pulmonary/Chest: Effort normal and breath sounds normal. No respiratory distress. He has no wheezes.  Abdominal: Soft. Bowel sounds are normal. There is tenderness in the epigastric area and left upper quadrant. There is no guarding.  Abdomen soft, non-distended, tenderness of epigastrium and LUQ  Musculoskeletal: Normal range of motion. He exhibits no edema.  Neurological: He is alert and oriented to  person, place, and time.  Skin: Skin is warm and dry. He is not diaphoretic.  Psychiatric: He has a normal mood and affect.  Nursing note and vitals reviewed.   ED Course  Procedures (including critical care time) Labs Review Labs Reviewed  CBC WITH DIFFERENTIAL/PLATELET - Abnormal; Notable for the following:    WBC 13.0 (*)    RDW 15.8 (*)    Neutrophils Relative % 83 (*)    Neutro Abs 10.8 (*)    All other components within normal limits  COMPREHENSIVE METABOLIC  PANEL - Abnormal; Notable for the following:    Glucose, Bld 147 (*)    Total Protein 8.3 (*)    All other components within normal limits  LIPASE, BLOOD - Abnormal; Notable for the following:    Lipase 20 (*)    All other components within normal limits    Imaging Review No results found.   EKG Interpretation None      MDM   Final diagnoses:  Nausea and vomiting, vomiting of unspecified type  Epigastric pain   39 year old male here with epigastric pain, nausea, and vomiting. Seen here 2 days ago for the same and reports recurrent symptoms. Patient is afebrile, nontoxic. He has some tenderness of his epigastrium and left upper quadrant without rebound or guarding. No peritonitis. Will repeat labs.  IVF, pain meds, anti-emetics, and pepcid ordered.  7:00 PM Patient reassessed.  States he is still feeling somewhat nauseated.  Has had some dry heaves but no active vomiting.  Pain is better at this time.  Will give additional fluids and zofran.  After additional liter of fluids and zofran patient has tolerated PO gingerale without difficulty.  He states he is feeling better.  No active vomiting here in the ED.  Repeat abdominal exam improved.  VS remain stable.  Will d/c home with continued supportive care, Rx phenergan suppositories if unable to tolerate PO.  Will FU with GI, another referral given.  Discussed plan with patient, he/she acknowledged understanding and agreed with plan of care.  Return precautions given for new or worsening symptoms.  Garlon HatchetLisa M Sanders, PA-C 06/08/15 2140  Elwin MochaBlair Walden, MD 06/08/15 2150

## 2015-06-08 NOTE — ED Notes (Signed)
Attending at bedside.

## 2015-06-08 NOTE — Discharge Instructions (Signed)
Take the prescribed medication as directed if unable to tolerate oral medications. Recommend to start with gentle diet and progress back to normal as tolerated. Follow-up with GI office-- call Monday to help schedule appt. Return to the ED for new or worsening symptoms.

## 2015-06-08 NOTE — ED Notes (Signed)
Pt tolerated  PO fluids well RN notified   

## 2015-06-08 NOTE — ED Notes (Addendum)
Pt complains of nausea/vomiting/abdominal pain since Thursday night. Pt was seen 2 days ago for same, states he cannot keep the nausea medication he received a prescription for down.

## 2015-06-08 NOTE — ED Notes (Signed)
Questions r/t dc were denied. Pt ambulatory and a&ox4, will use wheelchair for transport out

## 2015-06-10 ENCOUNTER — Encounter: Payer: Self-pay | Admitting: Gastroenterology

## 2015-06-27 ENCOUNTER — Encounter: Payer: Self-pay | Admitting: Gastroenterology

## 2015-06-27 ENCOUNTER — Ambulatory Visit (INDEPENDENT_AMBULATORY_CARE_PROVIDER_SITE_OTHER): Payer: 59 | Admitting: Gastroenterology

## 2015-06-27 VITALS — BP 130/70 | HR 84 | Ht 72.75 in | Wt 225.4 lb

## 2015-06-27 DIAGNOSIS — I700 Atherosclerosis of aorta: Secondary | ICD-10-CM | POA: Diagnosis not present

## 2015-06-27 DIAGNOSIS — R1115 Cyclical vomiting syndrome unrelated to migraine: Secondary | ICD-10-CM

## 2015-06-27 DIAGNOSIS — R112 Nausea with vomiting, unspecified: Secondary | ICD-10-CM | POA: Insufficient documentation

## 2015-06-27 DIAGNOSIS — G43A0 Cyclical vomiting, not intractable: Secondary | ICD-10-CM | POA: Diagnosis not present

## 2015-06-27 MED ORDER — ALPRAZOLAM 1 MG PO TABS: 1.0000 mg | ORAL_TABLET | Freq: Once | ORAL | Status: DC

## 2015-06-27 NOTE — Progress Notes (Signed)
_                                                                                                                History of Present Illness:  Tony Fuentes  is a 38 year old white male referred from the ED for evaluation of nausea vomiting and upper abdominal pain.  Over the past couple of years she's had 3 or 4 discrete episodes of protracted nausea, vomiting and crampy upper abdominal pain.  Episodes may last 3-4 days a time.  CT scan in April, 2016, which I reviewed, demonstrated moderate atherosclerotic changes in the aorta and iliac vessels.  Lab work in July and April was relevant for glucose from 128-147.  White count has ranged from a left 0.7-18.7.  In between episodes he feels perfectly well.  He denies pyrosis or change in bowel habits.  He has a history of purging.  He suffers from anxiety.  Past Medical History  Diagnosis Date  . Rib fracture   . Anxiety   . Depression   . Lung laceration   . Head injury   . Hypertension    Past Surgical History  Procedure Laterality Date  . Chest tubes     family history includes Cerebral palsy in his sister; Hypertension in his mother. Current Outpatient Prescriptions  Medication Sig Dispense Refill  . dicyclomine (BENTYL) 20 MG tablet Take 1 tablet (20 mg total) by mouth 2 (two) times daily as needed (abdominal pain). 10 tablet 0  . [DISCONTINUED] omeprazole (PRILOSEC) 20 MG capsule Take 1 capsule (20 mg total) by mouth daily. (Patient not taking: Reported on 06/06/2015) 30 capsule 0   No current facility-administered medications for this visit.   Allergies as of 06/27/2015  . (No Known Allergies)    reports that he quit smoking about 6 months ago. His smoking use included Cigarettes. He has a 20 pack-year smoking history. He has never used smokeless tobacco. He reports that he uses illicit drugs (Marijuana). He reports that he does not drink alcohol.   Review of Systems: Pertinent positive and negative review of systems  were noted in the above HPI section. All other review of systems were otherwise negative.  Vital signs were reviewed in today's medical record Physical Exam: General: Well developed , well nourished, no acute distress Skin: anicteric Head: Normocephalic and atraumatic Eyes:  sclerae anicteric, EOMI Ears: Normal auditory acuity Mouth: No deformity or lesions Neck: Supple, no masses or thyromegaly Lymph Nodes: no lymphadenopathy Lungs: Clear throughout to auscultation Heart: Regular rate and rhythm; no murmurs, rubs or bruits Gastroinestinal: Soft, non tender and non distended. No masses, hepatosplenomegaly or hernias noted. Normal Bowel sounds Rectal:deferred Musculoskeletal: Symmetrical with no gross deformities  Skin: No lesions on visible extremities Pulses:  Normal pulses noted Extremities: No clubbing, cyanosis, edema or deformities noted Neurological: Alert oriented x 4, grossly nonfocal Cervical Nodes:  No significant cervical adenopathy Inguinal Nodes: No significant inguinal adenopathy Psychological:  Alert and cooperative. Normal mood and affect  See Assessment and Plan  under Problem List

## 2015-06-27 NOTE — Assessment & Plan Note (Signed)
At least 2 year history of episodic severe nausea, vomiting, dehydration and crampy upper abdominal pain.  Workup has been unremarkable including CT and lab except for an elevated glucose and atherosclerotic changes in the major vessels early for his age.  History is notable for purging.  There is no evidence for obstruction.  Gastroparesis should be considered as well as peptic disease.  Psychogenic vomiting is also a possibility although length the symptoms seems a little bit long for this as well.  This would be an atypical presentation for a calculus cholecystitis Patient has a terrible fear of needles.    Recommendations #1 hemoglobin A1c #2 upper endoscopy (patient will be given Xanax 1 mg to be taken one hour before procedure) #3 to consider gastric emptying scan pending results of above #4 possible HIDA scan pending above

## 2015-06-27 NOTE — Assessment & Plan Note (Signed)
CT noted atherosclerosis of the aortic and iliac vessels which would be somewhat premature for his age.  This may coincide with diabetes.  Recommendations #1 to consider further evaluation by PCP or cardiology

## 2015-06-27 NOTE — Patient Instructions (Addendum)
You have been scheduled for an endoscopy and colonoscopy. Please follow the written instructions given to you at your visit today. Please pick up your prep supplies at the pharmacy within the next 1-3 days. If you use inhalers (even only as needed), please bring them with you on the day of your procedure. Your physician has requested that you go to www.startemmi.com and enter the access code given to you at your visit today. This web site gives a general overview about your procedure. However, you should still follow specific instructions given to you by our office regarding your preparation for the procedure.  Go to the basement for labs today , per your request you did not want labs drawn today. Per Dr Arlyce Dice come in and have your labs drawn before your procedure  We will prescribe you 1 xanax to take 1 hour prior to your procedure per Dr Arlyce Dice

## 2015-08-16 ENCOUNTER — Encounter: Payer: Self-pay | Admitting: Gastroenterology

## 2015-08-16 ENCOUNTER — Ambulatory Visit (AMBULATORY_SURGERY_CENTER): Payer: 59 | Admitting: Gastroenterology

## 2015-08-16 VITALS — BP 132/71 | HR 94 | Temp 98.9°F | Resp 22 | Ht 72.75 in | Wt 225.0 lb

## 2015-08-16 DIAGNOSIS — G43A0 Cyclical vomiting, not intractable: Secondary | ICD-10-CM | POA: Diagnosis present

## 2015-08-16 DIAGNOSIS — R1115 Cyclical vomiting syndrome unrelated to migraine: Secondary | ICD-10-CM

## 2015-08-16 DIAGNOSIS — K2980 Duodenitis without bleeding: Secondary | ICD-10-CM | POA: Diagnosis not present

## 2015-08-16 MED ORDER — SODIUM CHLORIDE 0.9 % IV SOLN: 500.0000 mL | INTRAVENOUS | Status: DC

## 2015-08-16 MED ORDER — FAMOTIDINE 40 MG PO TABS: 40.0000 mg | ORAL_TABLET | Freq: Every day | ORAL | Status: DC

## 2015-08-16 NOTE — Patient Instructions (Signed)
YOU HAD AN ENDOSCOPIC PROCEDURE TODAY AT THE [facility]:   Refer to the procedure report that was given to you for any specific questions about what was found during the examination.  If the procedure report does not answer your questions, please call your gastroenterologist to clarify.  If you requested that your care partner not be given the details of your procedure findings, then the procedure report has been included in a sealed envelope for you to review at your convenience later.  YOU SHOULD EXPECT: Some feelings of bloating in the abdomen. Passage of more gas than usual.  Walking can help get rid of the air that was put into your GI tract during the procedure and reduce the bloating. If you had a lower endoscopy (such as a colonoscopy or flexible sigmoidoscopy) you may notice spotting of blood in your stool or on the toilet paper. If you underwent a bowel prep for your procedure, you may not have a normal bowel movement for a few days.  Please Note:  You might notice some irritation and congestion in your nose or some drainage.  This is from the oxygen used during your procedure.  There is no need for concern and it should clear up in a day or so.  SYMPTOMS TO REPORT IMMEDIATELY:    Following upper endoscopy (EGD)  Vomiting of blood or coffee ground material  New chest pain or pain under the shoulder blades  Painful or persistently difficult swallowing  New shortness of breath  Fever of 100F or higher  Black, tarry-looking stools  For urgent or emergent issues, a gastroenterologist can be reached at any hour by calling [phone].   DIET: Your first meal following the procedure should be a small meal and then it is ok to progress to your normal diet. Heavy or fried foods are harder to digest and may make you feel nauseous or bloated.  Likewise, meals heavy in dairy and vegetables can increase bloating.  Drink plenty of fluids but you should avoid alcoholic beverages  for 24 hours.  ACTIVITY:  You should plan to take it easy for the rest of today and you should NOT DRIVE or use heavy machinery until tomorrow (because of the sedation medicines used during the test).    FOLLOW UP: Our staff will call the number listed on your records the next business day following your procedure to check on you and address any questions or concerns that you may have regarding the information given to you following your procedure. If we do not reach you, we will leave a message.  However, if you are feeling well and you are not experiencing any problems, there is no need to return our call.  We will assume that you have returned to your regular daily activities without incident.  If any biopsies were taken you will be contacted by phone or by letter within the next 1-3 weeks.  Please call us at [phone] if you have not heard about the biopsies in 3 weeks.    SIGNATURES/CONFIDENTIALITY: You and/or your care partner have signed paperwork which will be entered into your electronic medical record.  These signatures attest to the fact that that the information above on your After Visit Summary has been reviewed and is understood.  Full responsibility of the confidentiality of this discharge information lies with you and/or your care-partner. 

## 2015-08-16 NOTE — Progress Notes (Signed)
To recovery, report to [place], RN, VSS 

## 2015-08-16 NOTE — Progress Notes (Signed)
Called to room to assist during endoscopic procedure.  Patient ID and intended procedure confirmed with present staff. Received instructions for my participation in the procedure from the performing physician.  

## 2015-08-16 NOTE — Op Note (Signed)
[facility] [address] [place], [address]   ENDOSCOPY PROCEDURE REPORT  PATIENT: Tony Fuentes, Tony Fuentes  MR#: [id number] BIRTHDATE: 10-09-1976 , 39  yrs. old GENDER: male ENDOSCOPIST: Louis Meckel, MD REFERRED BY: PROCEDURE DATE:  08/16/2015 PROCEDURE:  EGD w/ biopsy ASA CLASS:     Class II INDICATIONS:  nausea and vomiting. MEDICATIONS: Monitored anesthesia care and Propofol 250 mg IV TOPICAL ANESTHETIC:  DESCRIPTION OF PROCEDURE: After the risks benefits and alternatives of the procedure were thoroughly explained, informed consent was obtained.  The LB [id number] endoscope was introduced through the mouth and advanced to the second portion of the duodenum , Without limitations.  The instrument was slowly withdrawn as the mucosa was fully examined.    DUODENUM: Mild duodenal inflammation was found in the duodenal bulb. A biopsy was performed.   Except for the findings listed, the EGD was otherwise normal.  Retroflexed views revealed no abnormalities. The scope was then withdrawn from the patient and the procedure completed.  COMPLICATIONS: There were no immediate complications.  ENDOSCOPIC IMPRESSION: 1.   Duodenal inflammation was found in the duodenal bulb 2.   Biopsy was performed 3.   EGD was otherwise normal  RECOMMENDATIONS: Begin Pepcid 40 mg daily Gastric emptying scan  REPEAT EXAM:  eSigned:  Louis Meckel, MD 08/16/2015 2:21 PM    CC:

## 2015-08-19 ENCOUNTER — Telehealth: Payer: Self-pay

## 2015-08-19 NOTE — Telephone Encounter (Signed)
Left message on answering machine. 

## 2015-08-20 ENCOUNTER — Encounter: Payer: Self-pay | Admitting: Gastroenterology

## 2015-08-26 ENCOUNTER — Telehealth: Payer: Self-pay

## 2015-08-26 NOTE — Telephone Encounter (Signed)
See the EGD procedure note. Needs a GES. Left a message for the patient to call back.

## 2015-08-29 NOTE — Telephone Encounter (Signed)
I have left message for the patient to call back 

## 2015-09-03 NOTE — Telephone Encounter (Signed)
He had a procedure on 08/16/15. Please see the report. He has not agreed to the gastric emptying study. I have called but had to leave a message for him. His follow up appointment was to be 09/26/15 with Dr Arlyce Dice. He has not chosen a new provider.

## 2015-09-03 NOTE — Telephone Encounter (Signed)
Left a message for the patient to call for the rescheduling his follow up appointment on 09/26/15.

## 2015-09-11 ENCOUNTER — Encounter: Payer: Self-pay | Admitting: Gastroenterology

## 2015-09-26 ENCOUNTER — Ambulatory Visit: Payer: 59 | Admitting: Gastroenterology

## 2015-09-26 ENCOUNTER — Ambulatory Visit: Payer: 59 | Admitting: Physician Assistant

## 2015-10-29 ENCOUNTER — Emergency Department (HOSPITAL_COMMUNITY)
Admission: EM | Admit: 2015-10-29 | Discharge: 2015-10-29 | Disposition: A | Discharge status: Home / Self Care | Payer: 59 | Attending: Emergency Medicine | Admitting: Emergency Medicine

## 2015-10-29 ENCOUNTER — Emergency Department (HOSPITAL_COMMUNITY): Payer: 59

## 2015-10-29 ENCOUNTER — Encounter (HOSPITAL_COMMUNITY): Payer: Self-pay | Admitting: Emergency Medicine

## 2015-10-29 DIAGNOSIS — IMO0001 Reserved for inherently not codable concepts without codable children: Secondary | ICD-10-CM

## 2015-10-29 DIAGNOSIS — G43A0 Cyclical vomiting, not intractable: Secondary | ICD-10-CM | POA: Diagnosis not present

## 2015-10-29 DIAGNOSIS — F329 Major depressive disorder, single episode, unspecified: Secondary | ICD-10-CM | POA: Insufficient documentation

## 2015-10-29 DIAGNOSIS — Z87891 Personal history of nicotine dependence: Secondary | ICD-10-CM | POA: Insufficient documentation

## 2015-10-29 DIAGNOSIS — D72829 Elevated white blood cell count, unspecified: Secondary | ICD-10-CM | POA: Diagnosis not present

## 2015-10-29 DIAGNOSIS — R1115 Cyclical vomiting syndrome unrelated to migraine: Secondary | ICD-10-CM

## 2015-10-29 DIAGNOSIS — I10 Essential (primary) hypertension: Secondary | ICD-10-CM | POA: Diagnosis not present

## 2015-10-29 DIAGNOSIS — F419 Anxiety disorder, unspecified: Secondary | ICD-10-CM | POA: Diagnosis not present

## 2015-10-29 DIAGNOSIS — F1290 Cannabis use, unspecified, uncomplicated: Secondary | ICD-10-CM | POA: Insufficient documentation

## 2015-10-29 DIAGNOSIS — R030 Elevated blood-pressure reading, without diagnosis of hypertension: Secondary | ICD-10-CM | POA: Diagnosis not present

## 2015-10-29 DIAGNOSIS — R1010 Upper abdominal pain, unspecified: Secondary | ICD-10-CM | POA: Diagnosis present

## 2015-10-29 DIAGNOSIS — R109 Unspecified abdominal pain: Secondary | ICD-10-CM

## 2015-10-29 DIAGNOSIS — Z79899 Other long term (current) drug therapy: Secondary | ICD-10-CM | POA: Insufficient documentation

## 2015-10-29 DIAGNOSIS — Z87828 Personal history of other (healed) physical injury and trauma: Secondary | ICD-10-CM | POA: Insufficient documentation

## 2015-10-29 DIAGNOSIS — Z7982 Long term (current) use of aspirin: Secondary | ICD-10-CM | POA: Diagnosis not present

## 2015-10-29 DIAGNOSIS — Z8781 Personal history of (healed) traumatic fracture: Secondary | ICD-10-CM | POA: Insufficient documentation

## 2015-10-29 LAB — URINALYSIS, ROUTINE W REFLEX MICROSCOPIC
BILIRUBIN URINE: NEGATIVE
GLUCOSE, UA: NEGATIVE mg/dL
HGB URINE DIPSTICK: NEGATIVE
KETONES UR: NEGATIVE mg/dL
Leukocytes, UA: NEGATIVE
Nitrite: NEGATIVE
PH: 8 (ref 5.0–8.0)
Protein, ur: >300 mg/dL — AB
SPECIFIC GRAVITY, URINE: 1.035 — AB (ref 1.005–1.030)

## 2015-10-29 LAB — COMPREHENSIVE METABOLIC PANEL
ALK PHOS: 62 U/L (ref 38–126)
ALT: 19 U/L (ref 17–63)
ANION GAP: 11 (ref 5–15)
AST: 27 U/L (ref 15–41)
Albumin: 4.3 g/dL (ref 3.5–5.0)
BUN: 14 mg/dL (ref 6–20)
CO2: 21 mmol/L — AB (ref 22–32)
Calcium: 9.8 mg/dL (ref 8.9–10.3)
Chloride: 106 mmol/L (ref 101–111)
Creatinine, Ser: 1.02 mg/dL (ref 0.61–1.24)
GFR calc Af Amer: >60 mL/min (ref >60)
GFR calc non Af Amer: >60 mL/min (ref >60)
GLUCOSE: 193 mg/dL — AB (ref 65–99)
Potassium: 4.2 mmol/L (ref 3.5–5.1)
SODIUM: 138 mmol/L (ref 135–145)
Total Bilirubin: 0.9 mg/dL (ref 0.3–1.2)
Total Protein: 7.7 g/dL (ref 6.5–8.1)

## 2015-10-29 LAB — URINE MICROSCOPIC-ADD ON

## 2015-10-29 LAB — CBC
HEMATOCRIT: 39.3 % (ref 39.0–52.0)
HEMOGLOBIN: 13.5 g/dL (ref 13.0–17.0)
MCH: 28.3 pg (ref 26.0–34.0)
MCHC: 34.4 g/dL (ref 30.0–36.0)
MCV: 82.4 fL (ref 78.0–100.0)
Platelets: 245 10*3/uL (ref 150–400)
RBC: 4.77 MIL/uL (ref 4.22–5.81)
RDW: 15.7 % — ABNORMAL HIGH (ref 11.5–15.5)
WBC: 14.6 10*3/uL — ABNORMAL HIGH (ref 4.0–10.5)

## 2015-10-29 LAB — LIPASE, BLOOD: Lipase: 29 U/L (ref 11–51)

## 2015-10-29 MED ORDER — SODIUM CHLORIDE 0.9 % IV BOLUS (SEPSIS)
1000.0000 mL | Freq: Once | INTRAVENOUS | Status: AC
  Administered 2015-10-29: 1000 mL via INTRAVENOUS

## 2015-10-29 MED ORDER — SODIUM CHLORIDE 0.9 % IV BOLUS (SEPSIS)
1000.0000 mL | Freq: Once | INTRAVENOUS | Status: AC
Start: 2015-10-29 — End: 2015-10-29
  Administered 2015-10-29: 1000 mL via INTRAVENOUS

## 2015-10-29 MED ORDER — PANTOPRAZOLE SODIUM 40 MG IV SOLR
40.0000 mg | Freq: Once | INTRAVENOUS | Status: AC
  Administered 2015-10-29: 40 mg via INTRAVENOUS
  Filled 2015-10-29: qty 40

## 2015-10-29 MED ORDER — DICYCLOMINE HCL 10 MG/ML IM SOLN
20.0000 mg | Freq: Once | INTRAMUSCULAR | Status: AC
Start: 2015-10-29 — End: 2015-10-29
  Administered 2015-10-29: 20 mg via INTRAMUSCULAR
  Filled 2015-10-29: qty 2

## 2015-10-29 MED ORDER — DICYCLOMINE HCL 20 MG PO TABS: 20.0000 mg | ORAL_TABLET | Freq: Two times a day (BID) | ORAL | Status: DC

## 2015-10-29 MED ORDER — FENTANYL CITRATE (PF) 100 MCG/2ML IJ SOLN
50.0000 ug | Freq: Once | INTRAMUSCULAR | Status: AC
  Administered 2015-10-29: 50 ug via INTRAVENOUS
  Filled 2015-10-29: qty 2

## 2015-10-29 MED ORDER — METOCLOPRAMIDE HCL 5 MG/ML IJ SOLN
10.0000 mg | Freq: Once | INTRAMUSCULAR | Status: AC
  Administered 2015-10-29: 10 mg via INTRAVENOUS
  Filled 2015-10-29: qty 2

## 2015-10-29 MED ORDER — FAMOTIDINE 40 MG/5ML PO SUSR: 40.0000 mg | Freq: Every day | ORAL | Status: DC

## 2015-10-29 MED ORDER — ONDANSETRON HCL 4 MG/2ML IJ SOLN
4.0000 mg | Freq: Once | INTRAMUSCULAR | Status: AC
  Administered 2015-10-29: 4 mg via INTRAVENOUS
  Filled 2015-10-29: qty 2

## 2015-10-29 NOTE — Discharge Instructions (Signed)
1. Medications: Pepcid and Bentyl for abdominal pain, continue usual home medications 2. Treatment: rest, drink plenty of fluids 3. Follow Up: Please follow up with the GI clinic listed above at their next available appointment for further evaluation and management of today's diagnosis. Also follow up with your primary doctor in 3 days as well; if you do not have a primary care doctor use the resource guide provided to find one; Please return to the ER for any new or worsening symptoms, any additional concerns.    Emergency Department Resource Guide 1) Find a Doctor and Pay Out of Pocket Although you won't have to find out who is covered by your insurance plan, it is a good idea to ask around and get recommendations. You will then need to call the office and see if the doctor you have chosen will accept you as a new patient and what types of options they offer for patients who are self-pay. Some doctors offer discounts or will set up payment plans for their patients who do not have insurance, but you will need to ask so you aren't surprised when you get to your appointment.  2) Contact Your Local Health Department Not all health departments have doctors that can see patients for sick visits, but many do, so it is worth a call to see if yours does. If you don't know where your local health department is, you can check in your phone book. The CDC also has a tool to help you locate your state's health department, and many state websites also have listings of all of their local health departments.  3) Find a Walk-in Clinic If your illness is not likely to be very severe or complicated, you may want to try a walk in clinic. These are popping up all over the country in pharmacies, drugstores, and shopping centers. They're usually staffed by nurse practitioners or physician assistants that have been trained to treat common illnesses and complaints. They're usually fairly quick and inexpensive. However, if you  have serious medical issues or chronic medical problems, these are probably not your best option.  No Primary Care Doctor: - Call Health Connect at  [phone] - they can help you locate a primary care doctor that  accepts your insurance, provides certain services, etc. - Physician Referral Service- [phone]  Chronic Pain Problems: Organization         Address  Phone   Notes  [facility]  [phone] Patients need to be referred by their primary care doctor.   Medication Assistance: Organization         Address  Phone   Notes  [facility] [address] [place], [place] [address] [phone] --Must be a resident of [facility] -- Must have NO insurance coverage whatsoever (no Medicaid/ Medicare, etc.) -- The pt. MUST have a primary care doctor that directs their care regularly and follows them in the community   MedAssist  [phone]   [employer]  [phone]    Agencies that provide inexpensive medical care: Organization         Address  Phone   Notes  [facility]  [phone]   [facility]    [phone]   [facility] [address] [place], [place] [address] [phone]   [facility] [place] [address], [place] [phone]   Planned Parenthood    (  [phone]   [facility]    [phone]   [facility] and [facility]  [address], [place] Phone:  [phone], Fax:  [phone] Hours of Operation:  9 am - 6 pm, M-F.  Also accepts Medicaid/Medicare and self-pay.  [facility]  [address], [place] Phone: [phone], Fax: [phone]. Hours of Operation:  8:30 am - 5:30 pm, M-F.  Also accepts Medicaid and self-pay.  [facility] [place] [address] [place] Phone: [phone]   [facility] [address]  Natasha Bence [place], [place] [phone], Ext. [phone] Mondays & Thursdays: 7-9 AM.  First 15 patients are seen on a first come, first serve basis.    Medicaid-accepting [facility] Providers:  Organization         Address  Phone   Notes  [facility] [address], [place] [phone] Also accepts self-pay patients.  [facility] [address] Laurell Josephs [place], [place]  [phone]   [facility] [address], [place] [phone]   [facility] [address], [place] [phone]   Renaye Rakers [address], [place]   [phone] Only accepts [place] patients after they have their name applied to their card.   Self-Pay (no insurance) in [facility]:  Organization         Address  Phone   Notes  Sickle Cell Patients, [facility] [address] [place], [place] [phone]   [facility] [address] [place], [place] [phone]   [facility]  [address], [place] [phone]   [facility]/Dr. Osei-Bonsu  [address] [place] or [address], [place] [phone] Phone number for both [place] and [place] locations is the same.  Urgent Medical and [facility] [address], [place] [phone]   [facility] [address], [place] or [address] [phone]   [facility] [address] [place] [phone], phone; [phone], fax Sees patients 1st and 3rd Saturday of every month.  Must not qualify for public or private insurance (i.e. Medicaid, Medicare, [place] Health Choice, Veterans' Benefits)  Household income should be no more than 200% of the poverty level The clinic cannot treat you if you are pregnant or think you are pregnant   Sexually transmitted diseases are not treated at the clinic.    Dental Care: Organization         Address  Phone  Notes  [facility] [address] [place], [place] [phone] Accepts children up to age 49 who are enrolled in [place] or [place] Health Choice; pregnant women with a Medicaid card; and children who have applied for Medicaid or [place] Health Choice, but were declined, whose parents can pay a reduced fee at time of service.  [facility]  [address], [place] [phone] Accepts children up to age 15 who are enrolled in [place] or [place] Health Choice; pregnant women with a Medicaid card; and children who have applied for Medicaid or [place] Health Choice, but were declined, whose parents can pay a  reduced fee at time of service.  [place] [facility] Access PROGRAM  [place] [phone] Patients are seen by appointment only. Walk-ins are not accepted. [place] will see patients 74 years of age and older. Monday - Tuesday (8am-5pm) Most Wednesdays (8:30-5pm) $30 per visit, cash only  [facility] Adult Dental Access PROGRAM  [address], [facility] [phone] Patients are seen by appointment only. Walk-ins are not accepted. [place] will see patients 35 years of age and older. One Wednesday Evening (Monthly: Volunteer Based).  $30 per visit, cash only  [place]  [phone] for adults; Children under age 59, call Graduate Pediatric Dentistry at [phone]. Children aged 51-14, please call [phone] to request a pediatric application.  Dental services are provided in all areas of dental care including fillings, crowns and bridges, complete and partial dentures, implants, gum treatment, root canals, and extractions. Preventive care is also provided. Treatment is provided to both adults and children. Patients  are selected via a lottery and there is often a waiting list.   [facility] [address] [place]  [phone] [email or web address]   [facility] [address] [place], [place] [phone], Ext. [phone] Second and Fourth Thursday of each month, opens at 6:30 AM; Clinic ends at 9 AM.  Patients are seen on a first-come first-served basis, and a limited number are seen during each clinic.   [facility]  [address] Hillard Danker [place], [place] [phone]   Eligibility Requirements You must have lived in [place], [place], or [place] counties for at least the last three months.   You cannot be eligible for state or federal sponsored [employer], including [employer], [place], or [employer].   You generally cannot be eligible for healthcare insurance through your employer.    How to apply: Eligibility screenings are held every Tuesday and Wednesday afternoon from 1:00 pm until 4:00 pm. You do not need an appointment for the interview!  [facility] [address] [place], [place]   [place]  [place] Department  [place]  [phone]    Behavioral Health Resources in the Community: Intensive Outpatient Programs Organization         Address  Phone  Notes  [place]. [address], [place], [place] [phone]   [facility]patient [address], [place], [place]   ADS: Alcohol & Drug Svcs [address] [place], [place]   [place] [address],  [place], [place] or [phone]   Substance Abuse Resources Organization         Address  Phone  Notes  Alcohol and Drug Services  7871029858   [place]  [phone]   The [place]   Chinita Pester  [phone]    Residential & Outpatient Substance Abuse Program  [phone]   Psychological Services Organization         Address  Phone  Notes  [facility]  [place]  [phone]   [place] [address], [place] or [phone]    Mobile Crisis Teams Organization         Address  Phone  Notes  [facility], Mobile Crisis Care Unit  [phone]  [facility]  [address] [place], [place]   [facility] [address] [place] [phone]    Self-Help/Support Groups Organization         Address  Phone             Notes  [facility] of [place] - variety of support groups  [place] Call for more information  Narcotics Anonymous (NA), [facility]aring [facility] [address], [employer] [place]  2 meetings at this location   Special educational needs teacher         Address  Phone  Notes  [facility] [place],    [place]  [phone]   [facility]  [address], [place] [phone], [place], [place]   [place], [place] [phone] Admissions: 8am-3pm M-F  Incentives Substance [place] [address]    [place], [place] [phone]   [facility] [address] [place], [place], [place]   [facility] [address]  [place] [address], [place]   Insight Programs - Intensive Outpatient [address] Kristeen [address], [place], [place]   [facility] (Americus.) [place].,  [place], [place] [phone] or [phone]   [facility] (RTS) [address], [place], [place] Accepts Medicaid  [facility] [place] [address],  [place] [phone] Substance Abuse/Addiction Treatment   [facility] Organization         Address  Phone  Notes  [facility]  [phone]   Domenic Schwab, PhD [address] Arlis Porta [place], [place]   [phone] or [phone]   Painter [place], [place] [phone]   [facility] [address] [place], [place] [phone] Insurance/Medicaid/sponsorship through [facility] and Families [address], [address] [place]                                    [place], [place] [phone] [place] [address] [place] [address], [place] [phone]    Dr. Adele Schilder  [phone]   [facility] Dept. 1) [address] [place] 2) [place] 3)  [place] [address], [place] [phone]  [phone]   [place] [phone] or [phone] (After Hours)

## 2015-10-29 NOTE — ED Provider Notes (Signed)
CSN: [id number]     Arrival date & time 10/29/15  1231 History   First MD Initiated Contact with Patient 10/29/15 1503     Chief Complaint  Patient presents with  . Abdominal Pain  . Nausea  . Emesis     (Consider location/radiation/quality/duration/timing/severity/associated sxs/prior Treatment) Patient is a 39 y.o. male presenting with abdominal pain and vomiting. The history is provided by the patient and medical records. No language interpreter was used.  Abdominal Pain Associated symptoms: nausea and vomiting   Associated symptoms: no constipation, no cough, no diarrhea, no shortness of breath and no sore throat   Emesis Associated symptoms: abdominal pain   Associated symptoms: no arthralgias, no diarrhea, no headaches, no myalgias and no sore throat    Tony Fuentes is a 39 y.o. male  with a PMH of HTN, anxiety who presents to the Emergency Department complaining of sharp, worsening upper abdominal pain since 08:00 this morning. He has a history of abdominal pain for the past two years.Pt. Has seen a GI physician for this, Dr. Arlyce Dice who did an endoscopy showing duodenal irritation He states that Dr. Arlyce Dice is no longer there and he has not seen a new GI doctor, therefore, he came here instead. Denies fever, diarrhea, constipation. Pt. Admits to daily marijuana use.    Past Medical History  Diagnosis Date  . Rib fracture   . Anxiety   . Depression   . Lung laceration   . Head injury   . Hypertension    Past Surgical History  Procedure Laterality Date  . Chest tubes     Family History  Problem Relation Age of Onset  . Hypertension Mother   . Cerebral palsy Sister    Social History  Substance Use Topics  . Smoking status: Former Smoker -- 1.00 packs/day for 20 years    Types: Cigarettes    Quit date: 12/24/2014  . Smokeless tobacco: Never Used  . Alcohol Use: No    Review of Systems  Constitutional: Negative.   HENT: Negative for congestion, rhinorrhea  and sore throat.   Eyes: Negative for visual disturbance.  Respiratory: Negative for cough, shortness of breath and wheezing.   Cardiovascular: Negative.   Gastrointestinal: Positive for nausea, vomiting and abdominal pain. Negative for diarrhea and constipation.  Endocrine: Negative for polydipsia and polyuria.  Musculoskeletal: Negative for myalgias, back pain, arthralgias and neck pain.  Skin: Negative for rash.  Neurological: Negative for dizziness, weakness and headaches.      Allergies  Review of patient's allergies indicates no known allergies.  Home Medications   Prior to Admission medications   Medication Sig Start Date End Date Taking? Authorizing Provider  aspirin EC 81 MG tablet Take 81 mg by mouth daily.   Yes Historical Provider, MD  metoCLOPramide (REGLAN) 10 MG tablet Take 10 mg by mouth every 6 (six) hours as needed for nausea or vomiting.    Yes Historical Provider, MD  ALPRAZolam Prudy Feeler) 1 MG tablet Take 1 tablet (1 mg total) by mouth once. Patient not taking: Reported on 10/29/2015 06/27/15   Louis Meckel, MD  dicyclomine (BENTYL) 20 MG tablet Take 1 tablet (20 mg total) by mouth 2 (two) times daily. 10/29/15   Jalyn Dutta Pilcher Kenyanna Grzesiak, PA-C  famotidine (PEPCID) 40 MG/5ML suspension Take 5 mLs (40 mg total) by mouth daily. 10/29/15   [place] Frances Pilcher Shalyn Koral, PA-C   BP 169/97 mmHg  Pulse 68  Temp(Src) 97.9 F (36.6 C) (Oral)  Resp 18  SpO2 99% Physical Exam  Constitutional: He is oriented to person, place, and time. He appears well-developed and well-nourished.  Anxious but in no acute distress  HENT:  Head: Normocephalic and atraumatic.  Cardiovascular: Normal rate, regular rhythm, normal heart sounds and intact distal pulses.  Exam reveals no gallop and no friction rub.   No murmur heard. Pulmonary/Chest: Effort normal and breath sounds normal. No respiratory distress. He has no wheezes. He has no rales. He exhibits no tenderness.  Abdominal: He exhibits no mass.  There is no rebound and no guarding.    Abdomen soft, non-distended Tenderness as depicted in image.  Bowel sounds positive in all four quadrants.  Musculoskeletal: He exhibits no edema.  Neurological: He is alert and oriented to person, place, and time.  Skin: Skin is warm and dry. No rash noted.  Nursing note and vitals reviewed.   ED Course  Procedures (including critical care time) Labs Review Labs Reviewed  COMPREHENSIVE METABOLIC PANEL - Abnormal; Notable for the following:    CO2 21 (*)    Glucose, Bld 193 (*)    All other components within normal limits  CBC - Abnormal; Notable for the following:    WBC 14.6 (*)    RDW 15.7 (*)    All other components within normal limits  URINALYSIS, ROUTINE W REFLEX MICROSCOPIC (NOT AT [facility]) - Abnormal; Notable for the following:    Color, Urine AMBER (*)    Specific Gravity, Urine 1.035 (*)    Protein, ur >300 (*)    All other components within normal limits  URINE MICROSCOPIC-ADD ON - Abnormal; Notable for the following:    Squamous Epithelial / LPF 0-5 (*)    Bacteria, UA RARE (*)    All other components within normal limits  LIPASE, BLOOD    Imaging Review US Abdomen Limited Ruq  10/29/2015  CLINICAL DATA:  Upper abdominal pain with vomiting EXAM: US ABDOMEN LIMITED - RIGHT UPPER QUADRANT COMPARISON:  CT abdomen and pelvis March 21, 2015 FINDINGS: Gallbladder: No gallstones or wall thickening visualized. There is no pericholecystic fluid. No sonographic Murphy sign noted. Common bile duct: Diameter: 2 mm. There is no intrahepatic or extrahepatic biliary duct dilatation. Liver: There is an echogenic focus in the anterior segment right lobe of the liver measuring 2.3 x 1.3 cm. No other focal liver lesions are identified. Within normal limits in parenchymal echogenicity. Right kidney appears mildly echogenic. IMPRESSION: Echogenic focus in the anterior segment of the right lobe of liver, likely a hemangioma. This finding may warrant a  followup study in approximately 1 year to assess for stability. No demonstrable gallbladder pathology or biliary duct dilatation. Right kidney is noted to be mildly echogenic. Question underlying medical renal disease. Electronically Signed   By: Bretta Bang III M.D.   On: 10/29/2015 16:30   I have personally reviewed and evaluated these images and lab results as part of my medical decision-making.   EKG Interpretation None      MDM   Final diagnoses:  Abdominal pain  Non-intractable cyclical vomiting with nausea  Elevated blood pressure  Leukocytosis   Tony Fuentes presents with n/v and abdominal pain. 2 year hx of similar sxs. Seen by GI in the past: chart reviewed - endoscopy showed duodenal irritation. GI recommended gastric emptying study, which it appears pt. Denied.  Labs and urine reviewed: UA with elevated sp gravity - 2L of fluids given.  U/S shows no gallbladder/biliary dz; hemangioma - will give GI referral  for follow-up: radiology recommends follow up study in ~ 1 year.   5:53 PM - Patient re-evaluated, feels improved. Will PO challenge and discharge to home with Pepcid and bentyl for symptomatic care. Elevated BP discussed, PCP follow up recommended.   I explained the diagnosis and have given precautions as to when/if to return to the ER including for any other new or worsening symptoms, as well as the importance of GI follow up. Informed patient that marijuana can be a major contributor to his abdominal pain and recommended quitting to see if it aids in improvement. The patient understands and accepts the medical plan as it's been dictated and I have answered their questions. Discharge instructions concerning home care and prescriptions have been given. The patient is stable and is discharged to home in good condition.   Patient discussed with Dr. Fredderick PhenixBelfi who agrees with treatment plan.   [facility] Pilcher Valene Villa, PA-C 10/29/15 1823  Rolan BuccoMelanie Belfi, MD 10/29/15  [phone]

## 2015-10-29 NOTE — ED Notes (Signed)
Pale pt c/o mid abd pain and NV that started 0800 this morning.  Pt states that he has hx of gastroenteritis for which he sees Dr. Arlyce DiceKaplan.  Pt states that he tried to go to his GI but that doctor no longer works there.  Pt denies any other medical hx or sx.

## 2015-10-29 NOTE — Progress Notes (Addendum)
[facility] ED CM noted pt with united health care coverage but no pcp listed Spoke with pt who states he has been seen by a male NP "[place] but can not remember her name" [facility] ED CM spoke with pt on how to obtain an in network pcp with insurance coverage via the customer service number or web site  Cm reviewed ED level of care for crisis/emergent services and community pcp level of care to manage continuous or chronic medical concerns.  The pt voiced understanding CM encouraged pt and discussed pt's responsibility to verify with pt's insurance carrier that any recommended medical provider offered by any emergency room or a hospital provider is within the carrier's network. The pt voiced understanding   Entered in de/c instructions Please follow up with the Nurse practitioner [place] Schedule an appointment as soon as possible for a visit Or use the united health care website or toll free number on the back of your card to verify or find a doctor in network for united health care to follow up with after leaving ED

## 2015-10-30 ENCOUNTER — Telehealth: Payer: Self-pay | Admitting: Gastroenterology

## 2015-10-30 NOTE — Telephone Encounter (Signed)
Spoke with Tony Fuentes. Patient will come to an appointment with Dr Lavon PaganiniNandigam 11/01/15 at 2:45 pm.

## 2015-11-01 ENCOUNTER — Ambulatory Visit (INDEPENDENT_AMBULATORY_CARE_PROVIDER_SITE_OTHER): Payer: 59 | Admitting: Gastroenterology

## 2015-11-01 ENCOUNTER — Encounter: Payer: Self-pay | Admitting: Gastroenterology

## 2015-11-01 VITALS — BP 100/68 | HR 68 | Ht 73.0 in | Wt 221.5 lb

## 2015-11-01 DIAGNOSIS — R112 Nausea with vomiting, unspecified: Secondary | ICD-10-CM

## 2015-11-01 DIAGNOSIS — F12988 Cannabis use, unspecified with other cannabis-induced disorder: Secondary | ICD-10-CM

## 2015-11-01 NOTE — Patient Instructions (Signed)
We have [facility]  We have given you a brochure today with there information Follow up as needed

## 2015-11-01 NOTE — Progress Notes (Signed)
Tony Fuentes    [id number]    [birth year]  Primary Care Physician:No PCP Per Patient  Referring Physician: ER  Chief complaint:  Intractable nausea and vomiting   HPI: 39 year old African-American male here for follow-up after recent ER visit with intractable nausea and vomiting. Patient reported having at least 4 episodes in the past year that required here revisit her hospitalization with severe dehydration due to the intractable nausea and vomiting. He quit alcohol and smoking cigarettes about 6 months ago with no change in his symptoms. He continues to smoke marijuana and has been smoking since he was age 30. He had EGD was unremarkable except for mild duodenitis, CT abdomen and pelvis and right upper quadrant ultrasound recently which showed a hemangioma but otherwise was unremarkable.    Outpatient Encounter Prescriptions as of 11/01/2015  Medication Sig  . aspirin EC 81 MG tablet Take 81 mg by mouth daily.  Marland Kitchen dicyclomine (BENTYL) 20 MG tablet Take 1 tablet (20 mg total) by mouth 2 (two) times daily.  . famotidine (PEPCID) 40 MG/5ML suspension Take 5 mLs (40 mg total) by mouth daily.  . metoCLOPramide (REGLAN) 10 MG tablet Take 10 mg by mouth every 6 (six) hours as needed for nausea or vomiting.   Marland Kitchen ALPRAZolam (XANAX) 1 MG tablet Take 1 tablet (1 mg total) by mouth once. (Patient not taking: Reported on 10/29/2015)   No facility-administered encounter medications on file as of 11/01/2015.    Allergies as of 11/01/2015  . (No Known Allergies)    Past Medical History  Diagnosis Date  . Rib fracture   . Anxiety   . Depression   . Lung laceration   . Head injury   . Hypertension     Past Surgical History  Procedure Laterality Date  . Chest tubes      Family History  Problem Relation Age of Onset  . Hypertension Mother   . Cerebral palsy Sister     Social History   Social History  . Marital Status: Married    Spouse Name: N/A  . Number of  Children: 0  . Years of Education: N/A   Occupational History  . unemployed    Social History Main Topics  . Smoking status: Former Smoker -- 1.00 packs/day for 20 years    Types: Cigarettes    Quit date: 12/24/2014  . Smokeless tobacco: Never Used  . Alcohol Use: No  . Drug Use: Yes    Special: Marijuana     Comment: daily use/ pt denies 08/16/2015  . Sexual Activity: Not on file   Other Topics Concern  . Not on file   Social History Narrative      Review of systems: Review of Systems  Constitutional: Negative for fever and chills.  HENT: Negative.   Eyes: Negative for blurred vision.  Respiratory: Negative for cough, shortness of breath and wheezing.   Cardiovascular: Negative for chest pain and palpitations.  Gastrointestinal: as per HPI Genitourinary: Negative for dysuria, urgency, frequency and hematuria.  Musculoskeletal: Negative for myalgias, back pain and joint pain.  Skin: Negative for itching and rash.  Neurological: Negative for dizziness, tremors, focal weakness, seizures and loss of consciousness.  Endo/Heme/Allergies: Negative for environmental allergies.  Psychiatric/Behavioral: Negative for depression, suicidal ideas and hallucinations.  All other systems reviewed and are negative.   Physical Exam: Filed Vitals:   11/01/15 1429  BP: 100/68  Pulse: 68   Gen:  No acute distress HEENT:  EOMI, sclera anicteric Neck:     No masses; no thyromegaly Lungs:    Clear to auscultation bilaterally; normal respiratory effort CV:         Regular rate and rhythm; no murmurs Abd:      + bowel sounds; soft, non-tender; no palpable masses, no distension Ext:    No edema; adequate peripheral perfusion Skin:      Warm and dry; no rash Neuro: alert and oriented x 3 Psych: normal mood and affect  Data Reviewed:  RUQ ultrasound 10/2015 No gallstones or wall thickening visualized. There is no pericholecystic fluid. No sonographic Murphy sign noted. Diameter:  2 mm. There is no intrahepatic or extrahepatic biliary duct dilatation. There is an echogenic focus in the anterior segment right lobe of the liver measuring 2.3 x 1.3 cm. No other focal liver lesions are identified. Within normal limits in parenchymal echogenicity. Right kidney appears mildly echogenic.  EGD 07/2015 Mild duodenal inflammation was found in the duodenal bulb. A biopsy was performed. Except for the findings listed, the EGD was otherwise normal. Retroflexed views revealed no abnormalities. The scope was then withdrawn from the patient and the procedure completed. - PEPTIC DUODENITIS. - NO DYSPLASIA OR MALIGNANCY.  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 Scherry RanVeena Sura Canul , MD [phone] Mon-Fri 8a-5p [phone] after 5p, weekends, holidays

## 2016-05-07 ENCOUNTER — Encounter (HOSPITAL_COMMUNITY): Payer: Self-pay | Admitting: *Deleted

## 2016-05-07 ENCOUNTER — Emergency Department (HOSPITAL_COMMUNITY)
Admission: EM | Admit: 2016-05-07 | Discharge: 2016-05-08 | Disposition: A | Discharge status: Home / Self Care | Payer: 59 | Attending: Emergency Medicine | Admitting: Emergency Medicine

## 2016-05-07 DIAGNOSIS — F329 Major depressive disorder, single episode, unspecified: Secondary | ICD-10-CM | POA: Insufficient documentation

## 2016-05-07 DIAGNOSIS — Z79899 Other long term (current) drug therapy: Secondary | ICD-10-CM | POA: Insufficient documentation

## 2016-05-07 DIAGNOSIS — I10 Essential (primary) hypertension: Secondary | ICD-10-CM | POA: Diagnosis not present

## 2016-05-07 DIAGNOSIS — Z7982 Long term (current) use of aspirin: Secondary | ICD-10-CM | POA: Insufficient documentation

## 2016-05-07 DIAGNOSIS — A059 Bacterial foodborne intoxication, unspecified: Secondary | ICD-10-CM

## 2016-05-07 DIAGNOSIS — F17210 Nicotine dependence, cigarettes, uncomplicated: Secondary | ICD-10-CM | POA: Insufficient documentation

## 2016-05-07 DIAGNOSIS — R1084 Generalized abdominal pain: Secondary | ICD-10-CM

## 2016-05-07 DIAGNOSIS — K529 Noninfective gastroenteritis and colitis, unspecified: Secondary | ICD-10-CM | POA: Diagnosis not present

## 2016-05-07 DIAGNOSIS — R197 Diarrhea, unspecified: Secondary | ICD-10-CM

## 2016-05-07 DIAGNOSIS — R112 Nausea with vomiting, unspecified: Secondary | ICD-10-CM

## 2016-05-07 DIAGNOSIS — T61771A Other fish poisoning, accidental (unintentional), initial encounter: Secondary | ICD-10-CM | POA: Insufficient documentation

## 2016-05-07 DIAGNOSIS — D72829 Elevated white blood cell count, unspecified: Secondary | ICD-10-CM | POA: Insufficient documentation

## 2016-05-07 LAB — CBC WITH DIFFERENTIAL/PLATELET
Basophils Absolute: 0 10*3/uL (ref 0.0–0.1)
Basophils Relative: 0 %
EOS PCT: 0 %
Eosinophils Absolute: 0 10*3/uL (ref 0.0–0.7)
HEMATOCRIT: 38.2 % — AB (ref 39.0–52.0)
Hemoglobin: 13.7 g/dL (ref 13.0–17.0)
LYMPHS PCT: 7 %
Lymphs Abs: 0.9 10*3/uL (ref 0.7–4.0)
MCH: 29.1 pg (ref 26.0–34.0)
MCHC: 35.9 g/dL (ref 30.0–36.0)
MCV: 81.3 fL (ref 78.0–100.0)
MONO ABS: 0.4 10*3/uL (ref 0.1–1.0)
MONOS PCT: 3 %
NEUTROS ABS: 12.8 10*3/uL — AB (ref 1.7–7.7)
Neutrophils Relative %: 90 %
PLATELETS: 189 10*3/uL (ref 150–400)
RBC: 4.7 MIL/uL (ref 4.22–5.81)
RDW: 14.9 % (ref 11.5–15.5)
WBC: 14.1 10*3/uL — ABNORMAL HIGH (ref 4.0–10.5)

## 2016-05-07 LAB — URINALYSIS, ROUTINE W REFLEX MICROSCOPIC
Bilirubin Urine: NEGATIVE
GLUCOSE, UA: NEGATIVE mg/dL
HGB URINE DIPSTICK: NEGATIVE
LEUKOCYTES UA: NEGATIVE
Nitrite: NEGATIVE
PH: 8.5 — AB (ref 5.0–8.0)
Specific Gravity, Urine: 1.036 — ABNORMAL HIGH (ref 1.005–1.030)

## 2016-05-07 LAB — COMPREHENSIVE METABOLIC PANEL
ALBUMIN: 4.8 g/dL (ref 3.5–5.0)
ALK PHOS: 64 U/L (ref 38–126)
ALT: 18 U/L (ref 17–63)
ANION GAP: 13 (ref 5–15)
AST: 32 U/L (ref 15–41)
BILIRUBIN TOTAL: 1.3 mg/dL — AB (ref 0.3–1.2)
BUN: 11 mg/dL (ref 6–20)
CALCIUM: 9.7 mg/dL (ref 8.9–10.3)
CO2: 22 mmol/L (ref 22–32)
Chloride: 101 mmol/L (ref 101–111)
Creatinine, Ser: 0.87 mg/dL (ref 0.61–1.24)
GFR calc Af Amer: >60 mL/min (ref >60)
GFR calc non Af Amer: >60 mL/min (ref >60)
GLUCOSE: 129 mg/dL — AB (ref 65–99)
Potassium: 4.3 mmol/L (ref 3.5–5.1)
SODIUM: 136 mmol/L (ref 135–145)
TOTAL PROTEIN: 8.5 g/dL — AB (ref 6.5–8.1)

## 2016-05-07 LAB — URINE MICROSCOPIC-ADD ON
RBC / HPF: NONE SEEN RBC/hpf (ref 0–5)
SQUAMOUS EPITHELIAL / LPF: NONE SEEN

## 2016-05-07 LAB — LIPASE, BLOOD: Lipase: 19 U/L (ref 11–51)

## 2016-05-07 MED ORDER — ONDANSETRON 4 MG PO TBDP: 4.0000 mg | ORAL_TABLET | Freq: Three times a day (TID) | ORAL | Status: DC | PRN

## 2016-05-07 MED ORDER — ONDANSETRON HCL 4 MG/2ML IJ SOLN: 4.0000 mg | Freq: Once | INTRAMUSCULAR | Status: DC | PRN

## 2016-05-07 MED ORDER — SODIUM CHLORIDE 0.9 % IV BOLUS (SEPSIS)
1000.0000 mL | Freq: Once | INTRAVENOUS | Status: AC
  Administered 2016-05-07: 1000 mL via INTRAVENOUS

## 2016-05-07 MED ORDER — PROMETHAZINE HCL 25 MG RE SUPP: 25.0000 mg | Freq: Four times a day (QID) | RECTAL | Status: DC | PRN

## 2016-05-07 MED ORDER — DICYCLOMINE HCL 20 MG PO TABS: 20.0000 mg | ORAL_TABLET | Freq: Two times a day (BID) | ORAL | Status: DC

## 2016-05-07 MED ORDER — DICYCLOMINE HCL 10 MG/ML IM SOLN
20.0000 mg | Freq: Once | INTRAMUSCULAR | Status: AC
  Administered 2016-05-07: 20 mg via INTRAMUSCULAR
  Filled 2016-05-07: qty 2

## 2016-05-07 MED ORDER — RANITIDINE HCL 150 MG PO TABS: 150.0000 mg | ORAL_TABLET | Freq: Two times a day (BID) | ORAL | Status: DC

## 2016-05-07 MED ORDER — MORPHINE SULFATE (PF) 4 MG/ML IV SOLN
4.0000 mg | Freq: Once | INTRAVENOUS | Status: AC
  Administered 2016-05-07: 4 mg via INTRAVENOUS
  Filled 2016-05-07: qty 1

## 2016-05-07 MED ORDER — FAMOTIDINE IN NACL 20-0.9 MG/50ML-% IV SOLN
20.0000 mg | Freq: Two times a day (BID) | INTRAVENOUS | Status: DC
  Administered 2016-05-07: 20 mg via INTRAVENOUS
  Filled 2016-05-07: qty 50

## 2016-05-07 NOTE — Discharge Instructions (Signed)
Use zofran and/or phenergan as prescribed, as needed for nausea. Use tylenol as needed for pain. Take bentyl as directed for abdominal cramping. Stay well hydrated with small sips of fluids throughout the day. Follow a BRAT (banana-rice-applesauce-toast) diet as described below for the next 24-48 hours. The 'BRAT' diet is suggested, then progress to diet as tolerated as symptoms abate.  You will need to take zantac as directed, and avoid spicy/fatty/acidic foods, avoid soda/coffee/tea/alcohol. Avoid laying down flat within 30 minutes of eating. Avoid NSAIDs like ibuprofen/aleve/motrin/etc on an empty stomach. May consider using over the counter tums/maalox as needed for additional relief.  Follow up with your gastroenterologist in one week for ongoing evaluation and management of your symptoms. Call your regular doctor if bloody stools, persistent diarrhea, vomiting, fever or abdominal pain. Return to the ER for changes or worsening symptoms.   Abdominal (belly) pain can be caused by many things. Your caregiver performed an examination and possibly ordered blood/urine tests and imaging (CT scan, x-rays, ultrasound). Many cases can be observed and treated at home after initial evaluation in the emergency department. Even though you are being discharged home, abdominal pain can be unpredictable. Therefore, you need a repeated exam if your pain does not resolve, returns, or worsens. Most patients with abdominal pain don't have to be admitted to the hospital or have surgery, but serious problems like appendicitis and gallbladder attacks can start out as nonspecific pain. Many abdominal conditions cannot be diagnosed in one visit, so follow-up evaluations are very important. SEEK IMMEDIATE MEDICAL ATTENTION IF YOU DEVELOP ANY OF THE FOLLOWING SYMPTOMS:  The pain does not go away or becomes severe.   A temperature above 101 develops.   Repeated vomiting occurs (multiple episodes).   The pain becomes  localized to portions of the abdomen. The right side could possibly be appendicitis. In an adult, the left lower portion of the abdomen could be colitis or diverticulitis.   Blood is being passed in stools or vomit (bright red or black tarry stools).   Return also if you develop chest pain, difficulty breathing, dizziness or fainting, or become confused, poorly responsive, or inconsolable (young children).  The constipation stays for more than 4 days.   There is belly (abdominal) or rectal pain.   You do not seem to be getting better.       Food Choices to Help Relieve Diarrhea When you have diarrhea, the foods you eat and your eating habits are very important. Choosing the right foods and drinks can help relieve diarrhea. Also, because diarrhea can last up to 7 days, you need to replace lost fluids and electrolytes (such as sodium, potassium, and chloride) in order to help prevent dehydration.  WHAT GENERAL GUIDELINES DO I NEED TO FOLLOW?  Slowly drink 1 cup (8 oz) of fluid for each episode of diarrhea. If you are getting enough fluid, your urine will be clear or pale yellow.  Eat starchy foods. Some good choices include white rice, white toast, pasta, low-fiber cereal, baked potatoes (without the skin), saltine crackers, and bagels.  Avoid large servings of any cooked vegetables.  Limit fruit to two servings per day. A serving is  cup or 1 small piece.  Choose foods with less than 2 g of fiber per serving.  Limit fats to less than 8 tsp (38 g) per day.  Avoid fried foods.  Eat foods that have probiotics in them. Probiotics can be found in certain dairy products.  Avoid foods and beverages that may  increase the speed at which food moves through the stomach and intestines (gastrointestinal tract). Things to avoid include:  High-fiber foods, such as dried fruit, raw fruits and vegetables, nuts, seeds, and whole grain foods.  Spicy foods and high-fat foods.  Foods and beverages  sweetened with high-fructose corn syrup, honey, or sugar alcohols such as xylitol, sorbitol, and mannitol. WHAT FOODS ARE RECOMMENDED? Grains White rice. White, [place], or pita breads (fresh or toasted), including plain rolls, buns, or bagels. White pasta. Saltine, soda, or graham crackers. Pretzels. Low-fiber cereal. Cooked cereals made with water (such as cornmeal, farina, or cream cereals). Plain muffins. Matzo. Melba toast. Zwieback.  Vegetables Potatoes (without the skin). Strained tomato and vegetable juices. Most well-cooked and canned vegetables without seeds. Tender lettuce. Fruits Cooked or canned applesauce, apricots, cherries, fruit cocktail, grapefruit, peaches, pears, or plums. Fresh bananas, apples without skin, cherries, grapes, cantaloupe, grapefruit, peaches, oranges, or plums.  Meat and Other Protein Products Baked or boiled chicken. Eggs. Tofu. Fish. Seafood. Smooth peanut butter. Ground or well-cooked tender beef, ham, veal, lamb, pork, or poultry.  Dairy Plain yogurt, kefir, and unsweetened liquid yogurt. Lactose-free milk, buttermilk, or soy milk. Plain hard cheese. Beverages Sport drinks. Clear broths. Diluted fruit juices (except prune). Regular, caffeine-free sodas such as ginger ale. Water. Decaffeinated teas. Oral rehydration solutions. Sugar-free beverages not sweetened with sugar alcohols. Other Bouillon, broth, or soups made from recommended foods.  The items listed above may not be a complete list of recommended foods or beverages. Contact your dietitian for more options. WHAT FOODS ARE NOT RECOMMENDED? Grains Whole grain, whole wheat, bran, or rye breads, rolls, pastas, crackers, and cereals. Wild or brown rice. Cereals that contain more than 2 g of fiber per serving. Corn tortillas or taco shells. Cooked or dry oatmeal. Granola. Popcorn. Vegetables Raw vegetables. Cabbage, broccoli, Brussels sprouts, artichokes, baked beans, beet greens, corn, kale, legumes,  peas, sweet potatoes, and yams. Potato skins. Cooked spinach and cabbage. Fruits Dried fruit, including raisins and dates. Raw fruits. Stewed or dried prunes. Fresh apples with skin, apricots, mangoes, pears, raspberries, and strawberries.  Meat and Other Protein Products Chunky peanut butter. Nuts and seeds. Beans and lentils. Tomasa BlaseBacon.  Dairy High-fat cheeses. Milk, chocolate milk, and beverages made with milk, such as milk shakes. Cream. Ice cream. Sweets and Desserts Sweet rolls, doughnuts, and sweet breads. Pancakes and waffles. Fats and Oils Butter. Cream sauces. Margarine. Salad oils. Plain salad dressings. Olives. Avocados.  Beverages Caffeinated beverages (such as coffee, tea, soda, or energy drinks). Alcoholic beverages. Fruit juices with pulp. Prune juice. Soft drinks sweetened with high-fructose corn syrup or sugar alcohols. Other Coconut. Hot sauce. Chili powder. Mayonnaise. Gravy. Cream-based or milk-based soups.  The items listed above may not be a complete list of foods and beverages to avoid. Contact your dietitian for more information. WHAT SHOULD I DO IF I BECOME DEHYDRATED? Diarrhea can sometimes lead to dehydration. Signs of dehydration include dark urine and dry mouth and skin. If you think you are dehydrated, you should rehydrate with an oral rehydration solution. These solutions can be purchased at pharmacies, retail stores, or online.  Drink -1 cup (120-240 mL) of oral rehydration solution each time you have an episode of diarrhea. If drinking this amount makes your diarrhea worse, try drinking smaller amounts more often. For example, drink 1-3 tsp (5-15 mL) every 5-10 minutes.  A general rule for staying hydrated is to drink 1-2 L of fluid per day. Talk to your health care provider about  the specific amount you should be drinking each day. Drink enough fluids to keep your urine clear or pale yellow. Document Released: 01/30/2004 Document Revised: 11/14/2013 Document  Reviewed: 10/02/2013 [facility] Patient Information 2015 [place], [place]. This information is not intended to replace advice given to you by your health care provider. Make sure you discuss any questions you have with your health care provider.   Nausea and Vomiting Nausea means you feel sick to your stomach. Throwing up (vomiting) is a reflex where stomach contents come out of your mouth. HOME CARE   Take medicine as told by your doctor.  Do not force yourself to eat. However, you do need to drink fluids.  If you feel like eating, eat a normal diet as told by your doctor.  Eat rice, wheat, potatoes, bread, lean meats, yogurt, fruits, and vegetables.  Avoid high-fat foods.  Drink enough fluids to keep your pee (urine) clear or pale yellow.  Ask your doctor how to replace body fluid losses (rehydrate). Signs of body fluid loss (dehydration) include:  Feeling very thirsty.  Dry lips and mouth.  Feeling dizzy.  Dark pee.  Peeing less than normal.  Feeling confused.  Fast breathing or heart rate. GET HELP RIGHT AWAY IF:   You have blood in your throw up.  You have black or bloody poop (stool).  You have a bad headache or stiff neck.  You feel confused.  You have bad belly (abdominal) pain.  You have chest pain or trouble breathing.  You do not pee at least once every 8 hours.  You have cold, clammy skin.  You keep throwing up after 24 to 48 hours.  You have a fever. MAKE SURE YOU:   Understand these instructions.  Will watch your condition.  Will get help right away if you are not doing well or get worse.   This information is not intended to replace advice given to you by your health care provider. Make sure you discuss any questions you have with your health care provider.   Document Released: 04/27/2008 Document Revised: 02/01/2012 Document Reviewed: 04/10/2011 Elsevier Interactive Patient Education 2016 [employer].  Food Poisoning Food poisoning  is an illness caused by something you ate or drank. It usually lasts 1 to 2 days. Problems may be worse for people with low immune systems, the elderly, children and infants, and pregnant women. HOME CARE  Drink enough water and fluids to keep your pee (urine) clear or pale yellow. Drink small amounts often.  Ask your doctor how to replace body fluid losses (rehydration).  Avoid:  Foods high in sugar.  Alcohol.  Bubbly (carbonated) drinks.  Tobacco.  Juice.  Caffeine drinks.  Very hot or cold fluids.  Fatty, greasy foods.  Eating too much at one time.  Dairy products until 24 to 48 hours after watery poop (diarrhea) stops.  You may eat foods with active cultures (probiotics). They can be found in some yogurts and supplements.  Wash your hands well to avoid spreading the illness.  Only take medicines as told by your doctor. Do not give aspirin to children.  Ask your doctor if you should keep taking your regular medicines. GET HELP RIGHT AWAY IF:   You have trouble breathing, swallowing, talking, or moving.  You have blurry vision.  You cannot keep fluids down.  You pass out (faint) or almost pass out.  Your eyes turn yellow.  You keep throwing up (vomiting) or having watery poop.  Belly (abdominal) pain starts,  gets worse, or is just in one small spot (localizes).  You have a fever.  Your watery poop has blood in it.  You feel very weak, dizzy, or thirsty.  You do not pee for 8 hours. MAKE SURE YOU:   Understand these instructions.  Will watch your condition.  Will get help right away if you are not doing well or get worse.   This information is not intended to replace advice given to you by your health care provider. Make sure you discuss any questions you have with your health care provider.   Document Released: 04/29/2010 Document Revised: 02/01/2012 Document Reviewed: 05/13/2015 Elsevier Interactive Patient Education 2016 Elsevier  Inc.  Diarrhea Diarrhea is frequent loose and watery bowel movements. It can cause you to feel weak and dehydrated. Dehydration can cause you to become tired and thirsty, have a dry mouth, and have decreased urination that often is dark yellow. Diarrhea is a sign of another problem, most often an infection that will not last long. In most cases, diarrhea typically lasts 2-3 days. However, it can last longer if it is a sign of something more serious. It is important to treat your diarrhea as directed by your caregiver to lessen or prevent future episodes of diarrhea. CAUSES  Some common causes include:  Gastrointestinal infections caused by viruses, bacteria, or parasites.  Food poisoning or food allergies.  Certain medicines, such as antibiotics, chemotherapy, and laxatives.  Artificial sweeteners and fructose.  Digestive disorders. HOME CARE INSTRUCTIONS  Ensure adequate fluid intake (hydration): Have 1 cup (8 oz) of fluid for each diarrhea episode. Avoid fluids that contain simple sugars or sports drinks, fruit juices, whole milk products, and sodas. Your urine should be clear or pale yellow if you are drinking enough fluids. Hydrate with an oral rehydration solution that you can purchase at pharmacies, retail stores, and online. You can prepare an oral rehydration solution at home by mixing the following ingredients together:   - tsp table salt.   tsp baking soda.   tsp salt substitute containing potassium chloride.  1  tablespoons sugar.  1 L (34 oz) of water.  Certain foods and beverages may increase the speed at which food moves through the gastrointestinal (GI) tract. These foods and beverages should be avoided and include:  Caffeinated and alcoholic beverages.  High-fiber foods, such as raw fruits and vegetables, nuts, seeds, and whole grain breads and cereals.  Foods and beverages sweetened with sugar alcohols, such as xylitol, sorbitol, and mannitol.  Some foods may  be well tolerated and may help thicken stool including:  Starchy foods, such as rice, toast, pasta, low-sugar cereal, oatmeal, grits, baked potatoes, crackers, and bagels.  Bananas.  Applesauce.  Add probiotic-rich foods to help increase healthy bacteria in the GI tract, such as yogurt and fermented milk products.  Wash your hands well after each diarrhea episode.  Only take over-the-counter or prescription medicines as directed by your caregiver.  Take a warm bath to relieve any burning or pain from frequent diarrhea episodes. SEEK IMMEDIATE MEDICAL CARE IF:   You are unable to keep fluids down.  You have persistent vomiting.  You have blood in your stool, or your stools are black and tarry.  You do not urinate in 6-8 hours, or there is only a small amount of very dark urine.  You have abdominal pain that increases or localizes.  You have weakness, dizziness, confusion, or light-headedness.  You have a severe headache.  Your diarrhea gets worse  or does not get better.  You have a fever or persistent symptoms for more than 2-3 days.  You have a fever and your symptoms suddenly get worse. MAKE SURE YOU:   Understand these instructions.  Will watch your condition.  Will get help right away if you are not doing well or get worse.   This information is not intended to replace advice given to you by your health care provider. Make sure you discuss any questions you have with your health care provider.   Document Released: 10/30/2002 Document Revised: 11/30/2014 Document Reviewed: 07/17/2012 Elsevier Interactive Patient Education 2016 Elsevier Inc.  Diet for Irritable Bowel Syndrome When you have irritable bowel syndrome (IBS), the foods you eat and your eating habits are very important. IBS may cause various symptoms, such as abdominal pain, constipation, or diarrhea. Choosing the right foods can help ease discomfort caused by these symptoms. Work with your health care  provider and dietitian to find the best eating plan to help control your symptoms. WHAT GENERAL GUIDELINES DO I NEED TO FOLLOW?  Keep a food diary. This will help you identify foods that cause symptoms. Write down:  What you eat and when.  What symptoms you have.  When symptoms occur in relation to your meals.  Avoid foods that cause symptoms. Talk with your dietitian about other ways to get the same nutrients that are in these foods.  Eat more foods that contain fiber. Take a fiber supplement if directed by your dietitian.  Eat your meals slowly, in a relaxed setting.  Aim to eat 5-6 small meals per day. Do not skip meals.  Drink enough fluids to keep your urine clear or pale yellow.  Ask your health care provider if you should take an over-the-counter probiotic during flare-ups to help restore healthy gut bacteria.  If you have cramping or diarrhea, try making your meals low in fat and high in carbohydrates. Examples of carbohydrates are pasta, rice, whole grain breads and cereals, fruits, and vegetables.  If dairy products cause your symptoms to flare up, try eating less of them. You might be able to handle yogurt better than other dairy products because it contains bacteria that help with digestion. WHAT FOODS ARE NOT RECOMMENDED? The following are some foods and drinks that may worsen your symptoms:  Fatty foods, such as [place] fries.  Milk products, such as cheese or ice cream.  Chocolate.  Alcohol.  Products with caffeine, such as coffee.  Carbonated drinks, such as soda. The items listed above may not be a complete list of foods and beverages to avoid. Contact your dietitian for more information. WHAT FOODS ARE GOOD SOURCES OF FIBER? Your health care provider or dietitian may recommend that you eat more foods that contain fiber. Fiber can help reduce constipation and other IBS symptoms. Add foods with fiber to your diet a little at a time so that your body can get  used to them. Too much fiber at once might cause gas and swelling of your abdomen. The following are some foods that are good sources of fiber:  Apples.  Peaches.  Pears.  Berries.  Figs.  Broccoli (raw).  Cabbage.  Carrots.  Raw peas.  Kidney beans.  Lima beans.  Whole grain bread.  Whole grain cereal. FOR MORE INFORMATION  International Foundation for Functional Gastrointestinal Disorders: www.iffgd.[employer] of Diabetes and Digestive and Kidney Diseases: http://norris-lawson.com/.aspx   This information is not intended to replace advice given to you by your health  care provider. Make sure you discuss any questions you have with your health care provider.   Document Released: 01/30/2004 Document Revised: 11/30/2014 Document Reviewed: 02/09/2014 Elsevier Interactive Patient Education 2016 Elsevier Inc.  Abdominal Pain, Adult Many things can cause belly (abdominal) pain. Most times, the belly pain is not dangerous. Many cases of belly pain can be watched and treated at home. HOME CARE   Do not take medicines that help you go poop (laxatives) unless told to by your doctor.  Only take medicine as told by your doctor.  Eat or drink as told by your doctor. Your doctor will tell you if you should be on a special diet. GET HELP IF:  You do not know what is causing your belly pain.  You have belly pain while you are sick to your stomach (nauseous) or have runny poop (diarrhea).  You have pain while you pee or poop.  Your belly pain wakes you up at night.  You have belly pain that gets worse or better when you eat.  You have belly pain that gets worse when you eat fatty foods.  You have a fever. GET HELP RIGHT AWAY IF:   The pain does not go away within 2 hours.  You keep throwing up (vomiting).  The pain changes and is only in the right or left part of the belly.  You have bloody or  tarry looking poop. MAKE SURE YOU:   Understand these instructions.  Will watch your condition.  Will get help right away if you are not doing well or get worse.   This information is not intended to replace advice given to you by your health care provider. Make sure you discuss any questions you have with your health care provider.   Document Released: 04/27/2008 Document Revised: 11/30/2014 Document Reviewed: 07/19/2013 Elsevier Interactive Patient Education [employer]

## 2016-05-07 NOTE — ED Notes (Signed)
Pt states that he woke up around 6am and ate some Yemenuna; pt states that he then started having nausea / vomiting and abd pain around 8am; pt states that he has had a few episodes of diarrhea; pt reports numerous episodes if vomiting; pt states "I went through 16 bottles of water today"; pt c/o abd cramping; pt states "I think I have food poisoning"; pt states that he took some nausea medication but was unable to keep them down

## 2016-05-07 NOTE — ED Notes (Signed)
Bed: WA19 Expected date:  Expected time:  Means of arrival:  Comments: Tr 2 

## 2016-05-07 NOTE — ED Provider Notes (Signed)
CSN: [id number]     Arrival date & time 05/07/16  2035 History   First MD Initiated Contact with Patient 05/07/16 2101     Chief Complaint  Patient presents with  . Abdominal Pain  . Emesis     (Consider location/radiation/quality/duration/timing/severity/associated sxs/prior Treatment) HPI Comments: Tony Fuentes is a 40 y.o. male with a PMHx of cannabinoid hyperemesis syndrome, peptic duodenitis on EGD, anxiety, head injury, and HTN, who presents to the ED with complaints of gradual onset nausea, vomiting, diarrhea, and abdominal pain after eating a can of tuna around 8 AM. He states he thinks he has food poisoning. He describes his abdominal pain is 10/10 generalized constant cramping, nonradiating, worse with vomiting and movement, and unrelieved with some Phenergan. He has had 2 episodes of nonbloody watery diarrhea and approximately 30 episodes of nonbloody nonbilious emesis prior to arrival. He states he is consumed approximately 16 bottles of water but everything keeps coming up. He states he has had similar symptoms in the past related to cyclic vomiting syndrome, his GI specialist is Dr. Lavon Paganini, last visit on 10/2015, reports he had an EGD in 07/2015 which only showed peptic duodenitis but otherwise was unremarkable. Today he thinks his symptoms are from the tuna he ate. Denies any marijuana use recently.  He denies any fevers, chills, chest pain, shortness breath, obstipation, constipation, melena, hematochezia, hematemesis, dysuria, hematuria, numbness, tingling, focal weakness, recent travel, recent antibiotic use, sick contacts, alcohol use, or chronic NSAID use.  Patient is a 40 y.o. male presenting with abdominal pain and vomiting. The history is provided by the patient and medical records. No language interpreter was used.  Abdominal Pain Pain location:  Generalized Pain quality: cramping   Pain radiates to:  Does not radiate Pain severity:  Severe Onset quality:   Gradual Duration:  13 hours Timing:  Constant Progression:  Unchanged Chronicity:  Recurrent Context: suspicious food intake   Context: not recent travel and not sick contacts   Relieved by:  Nothing Worsened by:  Movement and vomiting Ineffective treatments: phenergan. Associated symptoms: diarrhea, nausea and vomiting   Associated symptoms: no chest pain, no chills, no constipation, no dysuria, no fever, no flatus, no hematemesis, no hematochezia, no hematuria, no melena and no shortness of breath   Risk factors: no alcohol abuse, has not had multiple surgeries and no NSAID use   Emesis Associated symptoms: abdominal pain and diarrhea   Associated symptoms: no arthralgias, no chills and no myalgias     Past Medical History  Diagnosis Date  . Rib fracture   . Anxiety   . Depression   . Lung laceration   . Head injury   . Hypertension    Past Surgical History  Procedure Laterality Date  . Chest tubes     Family History  Problem Relation Age of Onset  . Hypertension Mother   . Cerebral palsy Sister    Social History  Substance Use Topics  . Smoking status: Current Some Day Smoker -- 1.00 packs/day for 20 years    Types: Cigarettes    Last Attempt to Quit: 12/24/2014  . Smokeless tobacco: Never Used  . Alcohol Use: 0.0 oz/week    0 Standard drinks or equivalent per week     Comment: socially    Review of Systems  Constitutional: Negative for fever and chills.  Respiratory: Negative for shortness of breath.   Cardiovascular: Negative for chest pain.  Gastrointestinal: Positive for nausea, vomiting, abdominal pain and diarrhea. Negative for  constipation, blood in stool, melena, hematochezia, anal bleeding, flatus and hematemesis.  Genitourinary: Negative for dysuria and hematuria.  Musculoskeletal: Negative for myalgias and arthralgias.  Skin: Negative for color change.  Allergic/Immunologic: Negative for immunocompromised state.  Neurological: Negative for  weakness and numbness.  Psychiatric/Behavioral: Negative for confusion.   10 Systems reviewed and are negative for acute change except as noted in the HPI.    Allergies  Review of patient's allergies indicates no known allergies.  Home Medications   Prior to Admission medications   Medication Sig Start Date End Date Taking? Authorizing Provider  ALPRAZolam Prudy Feeler(XANAX) 1 MG tablet Take 1 tablet (1 mg total) by mouth once. Patient not taking: Reported on 10/29/2015 06/27/15   Louis Meckelobert D Kaplan, MD  aspirin EC 81 MG tablet Take 81 mg by mouth daily.    Historical Provider, MD  dicyclomine (BENTYL) 20 MG tablet Take 1 tablet (20 mg total) by mouth 2 (two) times daily. 10/29/15   Jaime Pilcher Ward, PA-C  famotidine (PEPCID) 40 MG/5ML suspension Take 5 mLs (40 mg total) by mouth daily. 10/29/15   Chase PicketJaime Pilcher Ward, PA-C  metoCLOPramide (REGLAN) 10 MG tablet Take 10 mg by mouth every 6 (six) hours as needed for nausea or vomiting.     Historical Provider, MD   BP 163/74 mmHg  Pulse 75  Temp(Src) 98.8 F (37.1 C) (Oral)  Resp 18  Ht 6\' 2"  (1.88 m)  Wt 99.791 kg  BMI 28.23 kg/m2  SpO2 100% Physical Exam  Constitutional: He is oriented to person, place, and time. Vital signs are normal. He appears well-developed and well-nourished.  Non-toxic appearance. No distress.  Afebrile, nontoxic, NAD  HENT:  Head: Normocephalic and atraumatic.  Mouth/Throat: Mucous membranes are dry.  Very dry mucous membranes  Eyes: Conjunctivae and EOM are normal. Right eye exhibits no discharge. Left eye exhibits no discharge.  Neck: Normal range of motion. Neck supple.  Cardiovascular: Normal rate, regular rhythm, normal heart sounds and intact distal pulses.  Exam reveals no gallop and no friction rub.   No murmur heard. Pulmonary/Chest: Effort normal and breath sounds normal. No respiratory distress. He has no decreased breath sounds. He has no wheezes. He has no rhonchi. He has no rales.  Abdominal: Soft. Normal  appearance and bowel sounds are normal. He exhibits no distension. There is generalized tenderness. There is no rigidity, no rebound, no guarding, no CVA tenderness, no tenderness at McBurney's point and negative Murphy's sign.  Soft, nondistended, +BS throughout, with diffuse abdominal TTP without focal area of tenderness, no r/g/r, neg murphy's, neg mcburney's, no CVA TTP   Musculoskeletal: Normal range of motion.  Neurological: He is alert and oriented to person, place, and time. He has normal strength. No sensory deficit.  Skin: Skin is warm, dry and intact. No rash noted.  Psychiatric: He has a normal mood and affect.  Nursing note and vitals reviewed.   ED Course  Procedures (including critical care time) Labs Review Labs Reviewed  COMPREHENSIVE METABOLIC PANEL - Abnormal; Notable for the following:    Glucose, Bld 129 (*)    Total Protein 8.5 (*)    Total Bilirubin 1.3 (*)    All other components within normal limits  URINALYSIS, ROUTINE W REFLEX MICROSCOPIC (NOT AT [facility]) - Abnormal; Notable for the following:    Color, Urine AMBER (*)    Specific Gravity, Urine 1.036 (*)    pH 8.5 (*)    Ketones, ur >80 (*)    Protein, ur >  300 (*)    All other components within normal limits  CBC WITH DIFFERENTIAL/PLATELET - Abnormal; Notable for the following:    WBC 14.1 (*)    HCT 38.2 (*)    Neutro Abs 12.8 (*)    All other components within normal limits  URINE MICROSCOPIC-ADD ON - Abnormal; Notable for the following:    Bacteria, UA FEW (*)    All other components within normal limits  LIPASE, BLOOD    Imaging Review No results found.    RUQ ultrasound 10/2015 No gallstones or wall thickening visualized. There is no pericholecystic fluid. No sonographic Murphy sign noted. Diameter: 2 mm. There is no intrahepatic or extrahepatic biliary duct dilatation. There is an echogenic focus in the anterior segment right lobe of the liver measuring 2.3 x 1.3 cm. No other focal liver  lesions are identified. Within normal limits in parenchymal echogenicity. Right kidney appears mildly echogenic.  EGD 07/2015 Mild duodenal inflammation was found in the duodenal bulb. A biopsy was performed. Except for the findings listed, the EGD was otherwise normal. Retroflexed views revealed no abnormalities. The scope was then withdrawn from the patient and the procedure completed. - PEPTIC DUODENITIS. - NO DYSPLASIA OR MALIGNANCY.  I have personally reviewed and evaluated these images and lab results as part of my medical decision-making.   EKG Interpretation None      MDM   Final diagnoses:  Generalized abdominal pain  Nausea vomiting and diarrhea  Leukocytosis  Essential hypertension  Gastroenteritis  Food poisoning    40 y.o. male here with n/v/d/abd cramping after eating tuna this morning. Has had cyclical vomiting syndrome (from marjuana use) in the past, seen by GI, EGD done 07/2015 which just showed some peptic duodenitis but nothing else found. On exam, dry mucous membranes, diffuse abd tenderness without focal area of tenderness, nonperitoneal, neg murphy's or mcburney's tenderness. Afebrile and otherwise nontoxic. Likely foodborne illness vs cyclic vomiting vs gastroenteritis. Will get labs, U/A, and give fluids, zofran, morphine, pepcid, and bentyl. Doubt need for imaging at this time. Will reassess shortly  11:27 PM Pt feeling much better. Will give ginger ale for PO challenge. U/A with some evidence of dehydration but no evidence of UTI. CBC w/diff showing mildly elevated WBC 14.1 which could be from dehydration/demargination. Lipase WNL, CMP WNL aside from mildly elevated bili 1.3 likely from dehydration. Overall, likely foodborne illness vs gastroenteritis vs IBS. Will likely send home with zantac, zofran and phenergan, and bentyl, and discussed diet modifications for diarrhea and GERD/PUD. Tylenol PRN pain. F/up with GI in 1wk for ongoing management. Will recheck  after PO challenge done  12:00 AM Tolerating PO well, feels well, ready for d/c. Previously described plan discussed again. Rx's given as noted above. F/up with GI in 1wk. I explained the diagnosis and have given explicit precautions to return to the ER including for any other new or worsening symptoms. The patient understands and accepts the medical plan as it's been dictated and I have answered their questions. Discharge instructions concerning home care and prescriptions have been given. The patient is STABLE and is discharged to home in good condition.  BP 163/83 mmHg  Pulse 60  Temp(Src) 98.2 F (36.8 C) (Oral)  Resp 18  Ht  (1.88 m)  Wt 99.791 kg  BMI 28.23 kg/m2  SpO2 100%  Meds ordered this encounter  Medications  . ondansetron (ZOFRAN) injection 4 mg    Sig:   . sodium chloride 0.9 % bolus 1,000  mL    Sig:   . morphine 4 MG/ML injection 4 mg    Sig:   . dicyclomine (BENTYL) injection 20 mg    Sig:   . famotidine (PEPCID) IVPB 20 mg premix    Sig:   . dicyclomine (BENTYL) 20 MG tablet    Sig: Take 1 tablet (20 mg total) by mouth 2 (two) times daily.    Dispense:  60 tablet    Refill:  0    Order Specific Question:  Supervising Provider    Answer:  MILLER, BRIAN [[id number]]  . ranitidine (ZANTAC) 150 MG tablet    Sig: Take 1 tablet (150 mg total) by mouth 2 (two) times daily.    Dispense:  60 tablet    Refill:  0    Order Specific Question:  Supervising Provider    Answer:  MILLER, BRIAN [[id number]]  . ondansetron (ZOFRAN ODT) 4 MG disintegrating tablet    Sig: Take 1 tablet (4 mg total) by mouth every 8 (eight) hours as needed for nausea or vomiting.    Dispense:  15 tablet    Refill:  0    Order Specific Question:  Supervising Provider    Answer:  MILLER, BRIAN [[id number]]  . promethazine (PHENERGAN) 25 MG suppository    Sig: Place 1 suppository (25 mg total) rectally every 6 (six) hours as needed for nausea or vomiting.    Dispense:  15 suppository    Refill:  0     Order Specific Question:  Supervising Provider    Answer:  Eber Hong [[id number]]     Mykelti Goldenstein Camprubi-Soms, PA-C 05/08/16 0001  Jacalyn Lefevre, MD 05/11/16 [phone]
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
## 2017-03-04 IMAGING — CT CT ABD-PELV W/ CM
2 of 4 series · 16 of 46 positions shown, 18 images · IV contrast (OMNIPAQUE 300)
Comparison: 10/11/2013, 02/17/2010.

CLINICAL DATA: Acute onset of generalized abdominal pain earlier
today. Nausea and vomiting.

EXAM:
CT ABDOMEN AND PELVIS WITH CONTRAST
TECHNIQUE: Multidetector CT imaging of the abdomen and pelvis was performed
using the standard protocol following bolus administration of
intravenous contrast.
CONTRAST:  100mL OMNIPAQUE IOHEXOL 300 MG/ML IV. Oral contrast was
also administered.

[Series 2: abd/pel with · axial · 0.94mm/px · z∈[+1180,+1684]mm · 13 of 111 slices shown, 15 images]
[im 5/111  soft-tissue]
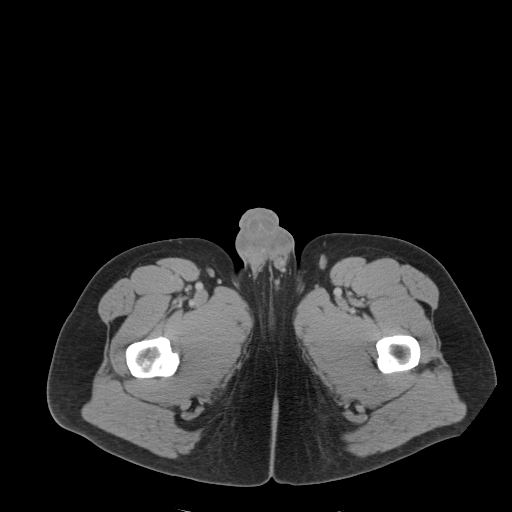
[im 5/111  bone]
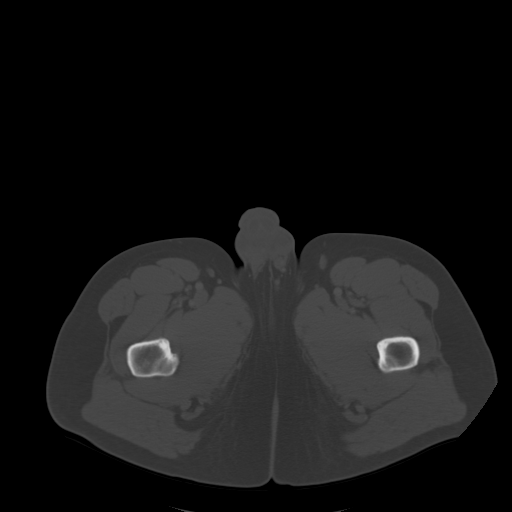
[im 13/111  soft-tissue]
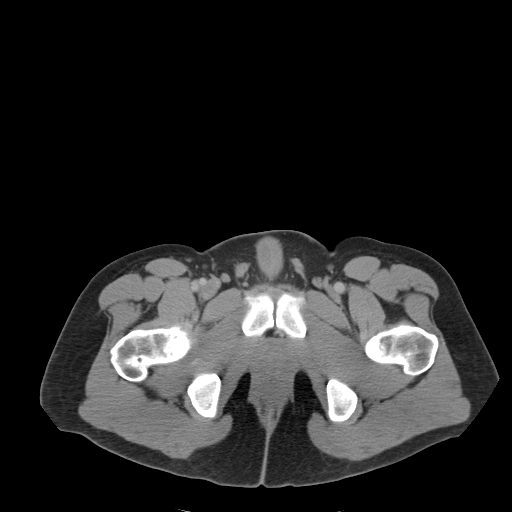
[im 22/111  soft-tissue]
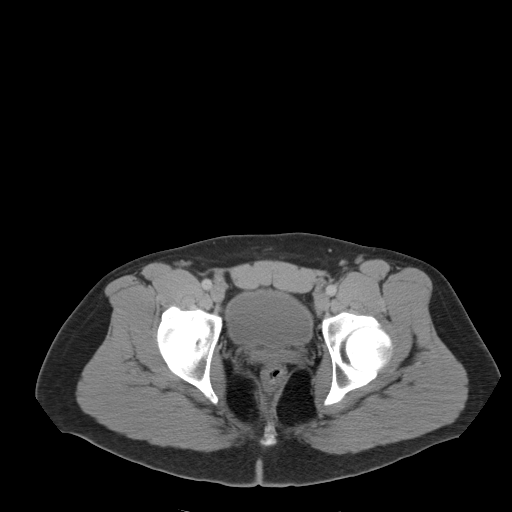
[im 30/111  soft-tissue]
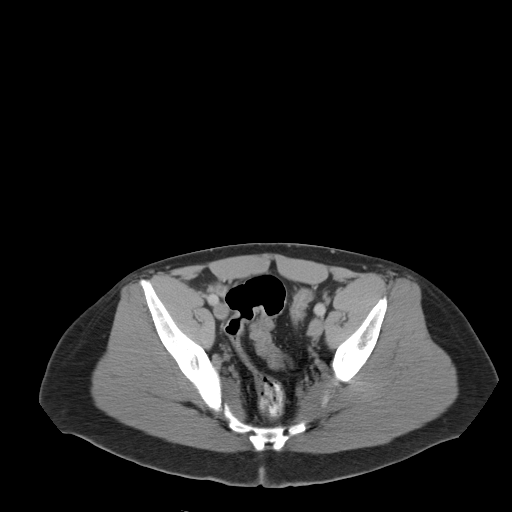
[im 39/111  soft-tissue]
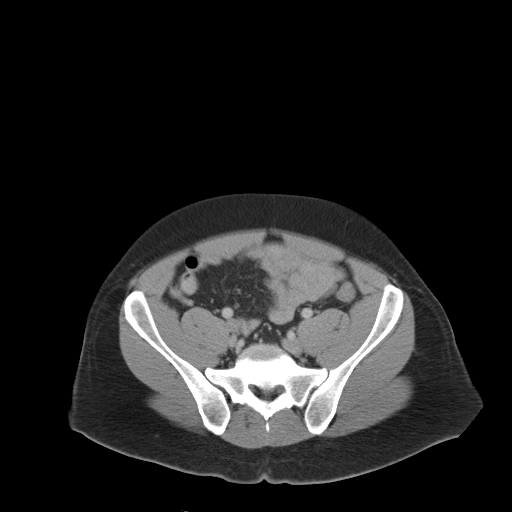
[im 47/111  soft-tissue]
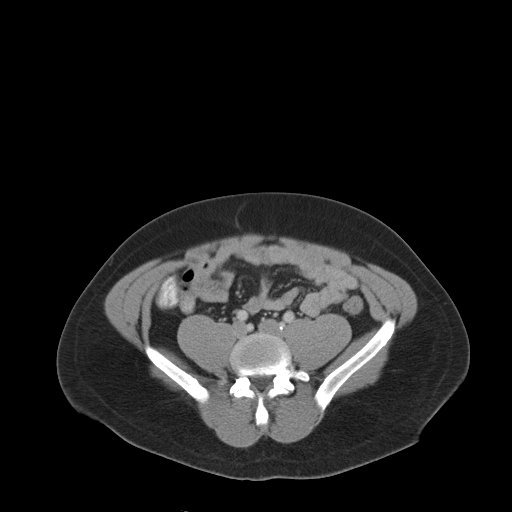
[im 56/111  soft-tissue]
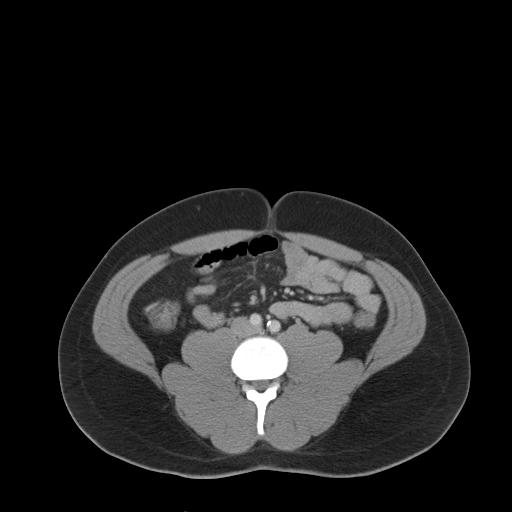
[im 64/111  soft-tissue]
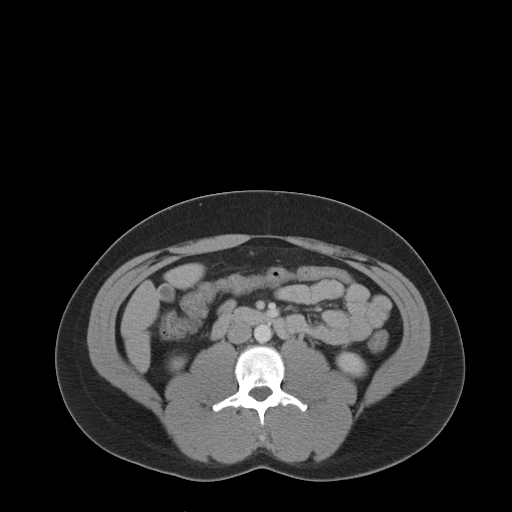
[im 72/111  soft-tissue]
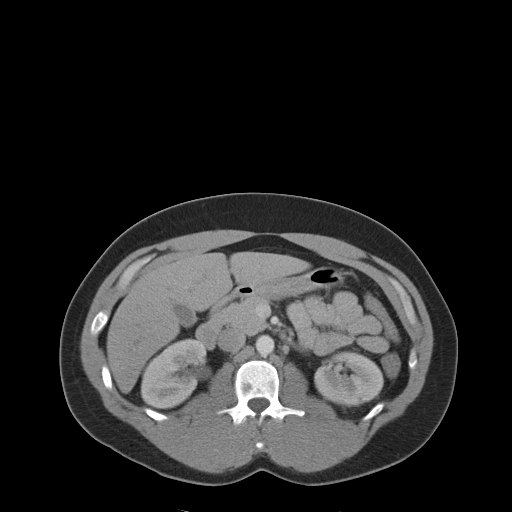
[im 72/111  bone]
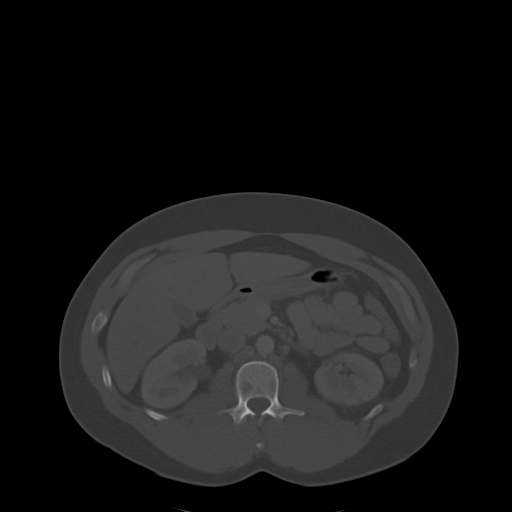
[im 81/111  soft-tissue]
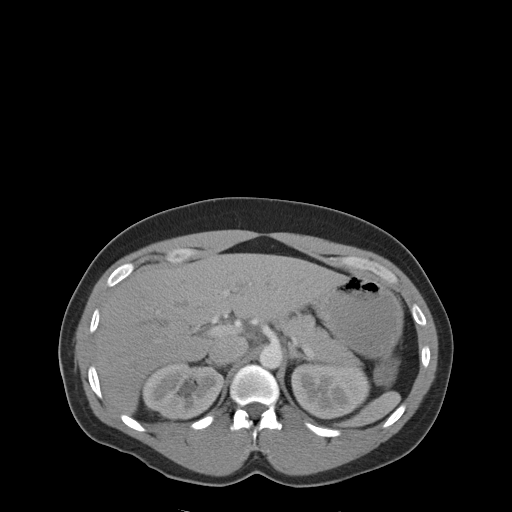
[im 89/111  soft-tissue]
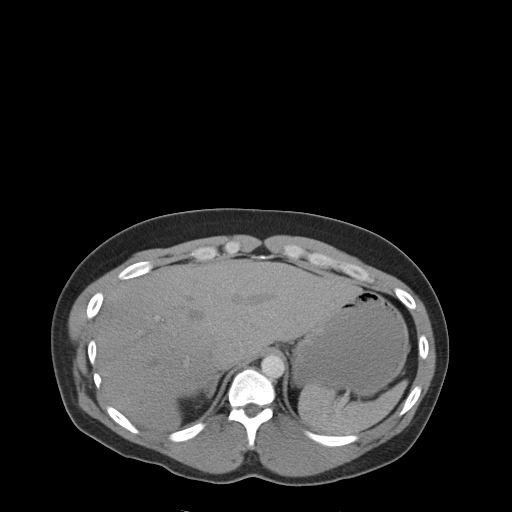
[im 98/111  soft-tissue]
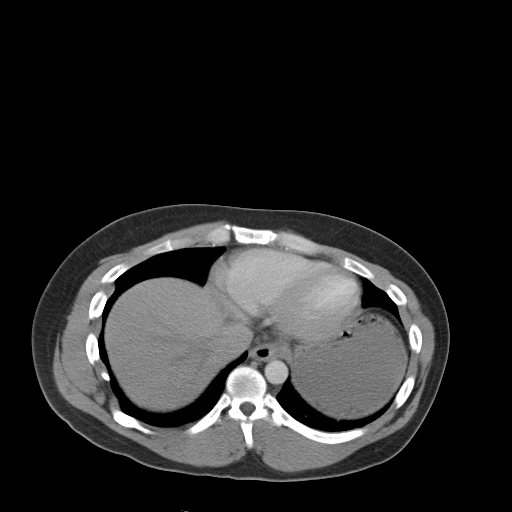
[im 106/111  soft-tissue]
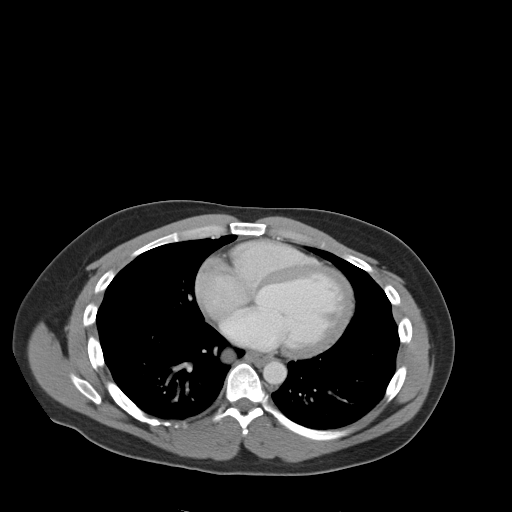

[Series 5: coronal a/|p · coronal · 0.74mm/px · 3 of 98 slices shown]
[im 33/98  soft-tissue]
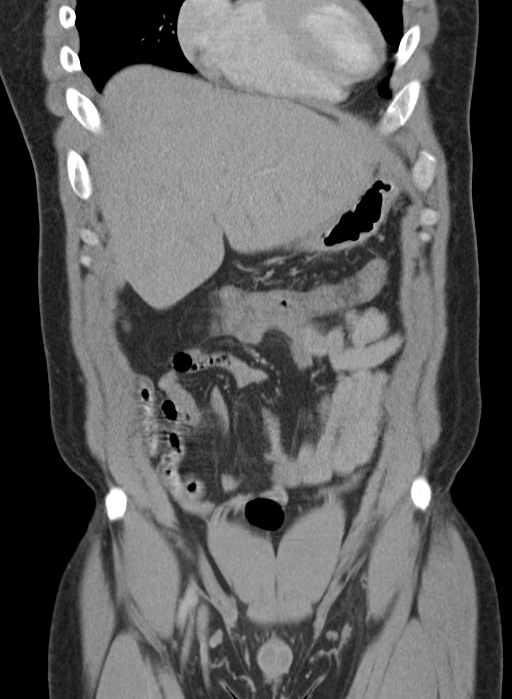
[im 44/98  soft-tissue]
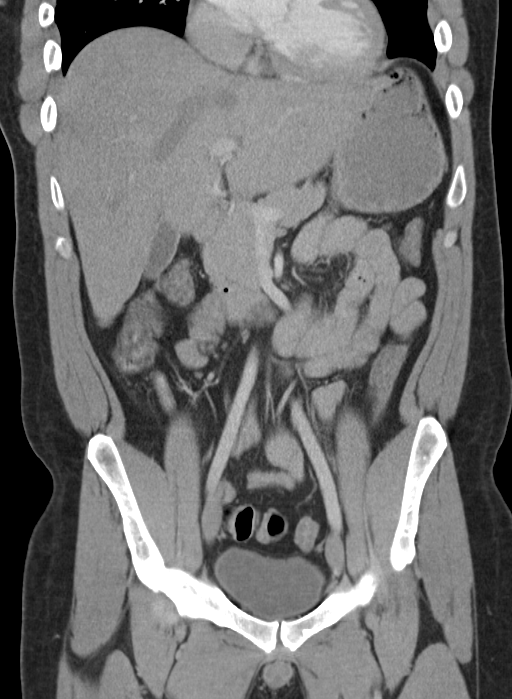
[im 54/98  soft-tissue]
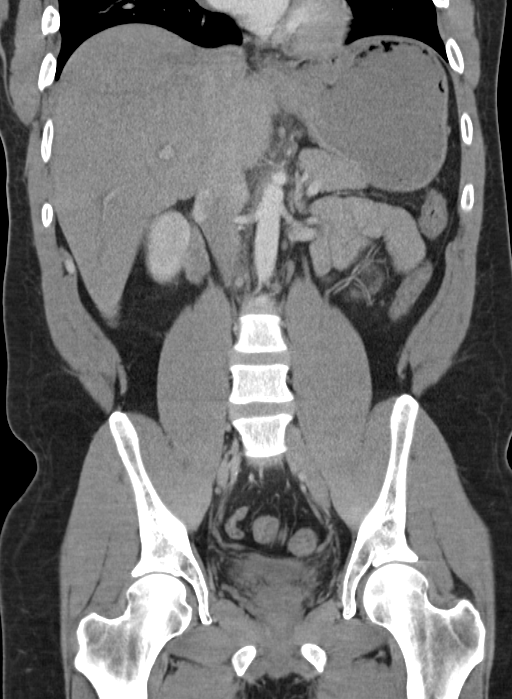

[16 of 46 positions shown; findings below may reference images not displayed]

FINDINGS: Liver:  Normal in size and appearance.

Biliary: Normal-appearing gallbladder without calcified gallstones.
No biliary ductal dilation.

Spleen:  Normal in size and appearance.

Pancreas:  Normal in appearance.  No pancreatic ductal dilation.

Adrenal glands:  Normal in appearance.

Genitourinary: Both kidneys normal in size and appearance. No
evidence of urinary tract calculi. Normal-appearing decompressed
urinary bladder.

Median lobe prostate gland enlargement. Remainder of the gland
normal in size and appearance. Normal seminal vesicles.

Gastrointestinal: Stomach normal in appearance for degree of
distention. Normal-appearing small bowel. Entire colon decompressed
which likely accounts for the apparent diffuse wall thickening.
Normal appendix in the right upper pelvis.

Vascular: Moderate aortoiliac atherosclerosis. Patent visceral
arteries. Accessory lower pole right renal artery.

Lymphatic:  No pathologic lymphadenopathy in the abdomen or pelvis.

Ascites: Absent.

Musculoskeletal: Regional skeleton intact.

Other findings: None.

Visualized lower thorax: Heart size upper normal. Lung bases clear
apart from the expected dependent atelectasis.
IMPRESSION: 1. Apparent wall thickening involving the entire colon I believe is
related to the fact that the colon is decompressed rather than true
colitis. Does the patient have diarrhea?
2. Otherwise, no acute abnormality in the abdomen or pelvis.
3. Moderate aortoiliac atherosclerosis, advanced for age.

## 2017-07-22 ENCOUNTER — Emergency Department (HOSPITAL_COMMUNITY)
Admission: EM | Admit: 2017-07-22 | Discharge: 2017-07-22 | Disposition: A | Discharge status: Home / Self Care | Payer: 59 | Attending: Emergency Medicine | Admitting: Emergency Medicine

## 2017-07-22 ENCOUNTER — Encounter (HOSPITAL_COMMUNITY): Payer: Self-pay

## 2017-07-22 DIAGNOSIS — Z5321 Procedure and treatment not carried out due to patient leaving prior to being seen by health care provider: Secondary | ICD-10-CM | POA: Insufficient documentation

## 2017-07-22 DIAGNOSIS — R51 Headache: Secondary | ICD-10-CM | POA: Insufficient documentation
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
## 2017-07-22 NOTE — ED Notes (Signed)
Pt states he wants to leave he doesn't want to wait.

## 2018-07-21 ENCOUNTER — Encounter (HOSPITAL_COMMUNITY): Payer: Self-pay

## 2018-07-21 ENCOUNTER — Other Ambulatory Visit: Payer: Self-pay

## 2018-07-21 ENCOUNTER — Emergency Department (HOSPITAL_COMMUNITY)
Admission: EM | Admit: 2018-07-21 | Discharge: 2018-07-21 | Disposition: A | Discharge status: Home / Self Care | Payer: Self-pay | Attending: Emergency Medicine | Admitting: Emergency Medicine

## 2018-07-21 DIAGNOSIS — F1290 Cannabis use, unspecified, uncomplicated: Secondary | ICD-10-CM

## 2018-07-21 DIAGNOSIS — F12988 Cannabis use, unspecified with other cannabis-induced disorder: Secondary | ICD-10-CM | POA: Insufficient documentation

## 2018-07-21 DIAGNOSIS — R1084 Generalized abdominal pain: Secondary | ICD-10-CM | POA: Insufficient documentation

## 2018-07-21 DIAGNOSIS — F17210 Nicotine dependence, cigarettes, uncomplicated: Secondary | ICD-10-CM | POA: Insufficient documentation

## 2018-07-21 DIAGNOSIS — R112 Nausea with vomiting, unspecified: Secondary | ICD-10-CM | POA: Insufficient documentation

## 2018-07-21 DIAGNOSIS — I10 Essential (primary) hypertension: Secondary | ICD-10-CM | POA: Insufficient documentation

## 2018-07-21 DIAGNOSIS — R109 Unspecified abdominal pain: Secondary | ICD-10-CM

## 2018-07-21 DIAGNOSIS — M7918 Myalgia, other site: Secondary | ICD-10-CM | POA: Insufficient documentation

## 2018-07-21 LAB — URINALYSIS, ROUTINE W REFLEX MICROSCOPIC
Bilirubin Urine: NEGATIVE
Glucose, UA: NEGATIVE mg/dL
KETONES UR: 80 mg/dL — AB
LEUKOCYTES UA: NEGATIVE
Nitrite: NEGATIVE
PH: 6 (ref 5.0–8.0)
Protein, ur: >300 mg/dL — AB
SPECIFIC GRAVITY, URINE: 1.024 (ref 1.005–1.030)

## 2018-07-21 LAB — COMPREHENSIVE METABOLIC PANEL
ALT: 53 U/L — AB (ref 0–44)
AST: 68 U/L — AB (ref 15–41)
Albumin: 5.1 g/dL — ABNORMAL HIGH (ref 3.5–5.0)
Alkaline Phosphatase: 88 U/L (ref 38–126)
Anion gap: 23 — ABNORMAL HIGH (ref 5–15)
BILIRUBIN TOTAL: 2 mg/dL — AB (ref 0.3–1.2)
BUN: 17 mg/dL (ref 6–20)
CHLORIDE: 96 mmol/L — AB (ref 98–111)
CO2: 21 mmol/L — ABNORMAL LOW (ref 22–32)
CREATININE: 1.67 mg/dL — AB (ref 0.61–1.24)
Calcium: 10.4 mg/dL — ABNORMAL HIGH (ref 8.9–10.3)
GFR, EST AFRICAN AMERICAN: 57 mL/min — AB (ref >60)
GFR, EST NON AFRICAN AMERICAN: 49 mL/min — AB (ref >60)
Glucose, Bld: 148 mg/dL — ABNORMAL HIGH (ref 70–99)
Potassium: 4.2 mmol/L (ref 3.5–5.1)
Sodium: 140 mmol/L (ref 135–145)
TOTAL PROTEIN: 9 g/dL — AB (ref 6.5–8.1)

## 2018-07-21 LAB — URINALYSIS, MICROSCOPIC (REFLEX)

## 2018-07-21 LAB — BASIC METABOLIC PANEL
ANION GAP: 18 — AB (ref 5–15)
BUN: 17 mg/dL (ref 6–20)
CALCIUM: 9 mg/dL (ref 8.9–10.3)
CO2: 21 mmol/L — ABNORMAL LOW (ref 22–32)
CREATININE: 1.49 mg/dL — AB (ref 0.61–1.24)
Chloride: 102 mmol/L (ref 98–111)
GFR calc Af Amer: >60 mL/min (ref >60)
GFR, EST NON AFRICAN AMERICAN: 56 mL/min — AB (ref >60)
Glucose, Bld: 111 mg/dL — ABNORMAL HIGH (ref 70–99)
Potassium: 4.1 mmol/L (ref 3.5–5.1)
Sodium: 141 mmol/L (ref 135–145)

## 2018-07-21 LAB — I-STAT CG4 LACTIC ACID, ED: LACTIC ACID, VENOUS: 1.28 mmol/L (ref 0.5–1.9)

## 2018-07-21 LAB — CBC
HCT: 41.9 % (ref 39.0–52.0)
Hemoglobin: 15 g/dL (ref 13.0–17.0)
MCH: 29.6 pg (ref 26.0–34.0)
MCHC: 35.8 g/dL (ref 30.0–36.0)
MCV: 82.6 fL (ref 78.0–100.0)
Platelets: 237 10*3/uL (ref 150–400)
RBC: 5.07 MIL/uL (ref 4.22–5.81)
RDW: 16.6 % — AB (ref 11.5–15.5)
WBC: 16.1 10*3/uL — AB (ref 4.0–10.5)

## 2018-07-21 LAB — LIPASE, BLOOD: LIPASE: 24 U/L (ref 11–51)

## 2018-07-21 LAB — ACETAMINOPHEN LEVEL

## 2018-07-21 LAB — SALICYLATE LEVEL

## 2018-07-21 MED ORDER — ONDANSETRON HCL 4 MG/2ML IJ SOLN
4.0000 mg | Freq: Once | INTRAMUSCULAR | Status: AC
  Administered 2018-07-21: 4 mg via INTRAVENOUS
  Filled 2018-07-21: qty 2

## 2018-07-21 MED ORDER — ONDANSETRON HCL 4 MG/2ML IJ SOLN: 4.0000 mg | Freq: Once | INTRAMUSCULAR | Status: DC | PRN

## 2018-07-21 MED ORDER — METOCLOPRAMIDE HCL 5 MG/ML IJ SOLN
10.0000 mg | Freq: Once | INTRAMUSCULAR | Status: AC
  Administered 2018-07-21: 10 mg via INTRAVENOUS
  Filled 2018-07-21: qty 2

## 2018-07-21 MED ORDER — RANITIDINE HCL 150 MG PO TABS: 150.0000 mg | ORAL_TABLET | Freq: Two times a day (BID) | ORAL | 0 refills | Status: AC

## 2018-07-21 MED ORDER — PROMETHAZINE HCL 25 MG PO TABS: 25.0000 mg | ORAL_TABLET | Freq: Four times a day (QID) | ORAL | 0 refills | Status: AC | PRN

## 2018-07-21 MED ORDER — FAMOTIDINE 20 MG PO TABS
40.0000 mg | ORAL_TABLET | Freq: Once | ORAL | Status: AC
  Administered 2018-07-21: 40 mg via ORAL
  Filled 2018-07-21: qty 2

## 2018-07-21 MED ORDER — SODIUM CHLORIDE 0.9 % IV BOLUS
1000.0000 mL | Freq: Once | INTRAVENOUS | Status: AC
  Administered 2018-07-21 (×2): 1000 mL via INTRAVENOUS

## 2018-07-21 MED ORDER — SODIUM CHLORIDE 0.9 % IV BOLUS
1000.0000 mL | Freq: Once | INTRAVENOUS | Status: AC
  Administered 2018-07-21: 1000 mL via INTRAVENOUS

## 2018-07-21 MED ORDER — PROMETHAZINE HCL 25 MG RE SUPP: 25.0000 mg | Freq: Four times a day (QID) | RECTAL | 0 refills | Status: AC | PRN

## 2018-07-21 MED ORDER — HYDROMORPHONE HCL 1 MG/ML IJ SOLN
1.0000 mg | Freq: Once | INTRAMUSCULAR | Status: AC
  Administered 2018-07-21: 1 mg via INTRAVENOUS
  Filled 2018-07-21: qty 1

## 2018-07-21 NOTE — ED Provider Notes (Signed)
Patient signed out by Dr Blinda LeatherwoodPollina to d/c to home if tolerating po.  Pt is tolerating po. Repeat bmet improved.   No abd pain. No fever.   Pt currently appears stable for d/c.      Tony Fuentes, Tony Springborn, MD 07/21/18 [phone]

## 2018-07-21 NOTE — ED Triage Notes (Signed)
Pt presents to ED from home for RUQ ABD pain and vomiting. Pt reports pain for 2 days, but it got worse tonight.
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
## 2018-07-21 NOTE — Discharge Instructions (Addendum)
It was our pleasure to provide your ER care today - we hope that you feel better.  Rest. Drink plenty of fluids. Take phenergan as need for nausea - no driving for the next 6 hours or when taking phenergan.   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 Avoiding marijuana use may prevent these symptoms from recurring.   Your blood pressure is high today - limit salt intake, and follow up with primary care doctor for recheck of blood pressure in the coming week.   Return to ER if worse, new symptoms, fevers, persistent vomiting, recurrent or persistent abdominal pain, other concern.
